# Patient Record
Sex: Female | Born: 1985 | Hispanic: No | Marital: Single | Smoking: Never smoker
Health system: Southern US, Community
[De-identification: ages and names within clinical notes are randomized; demographics above are authoritative.]

## PROBLEM LIST (undated history)

## (undated) DIAGNOSIS — A048 Other specified bacterial intestinal infections: Secondary | ICD-10-CM

## (undated) DIAGNOSIS — F419 Anxiety disorder, unspecified: Secondary | ICD-10-CM

## (undated) DIAGNOSIS — E785 Hyperlipidemia, unspecified: Secondary | ICD-10-CM

## (undated) DIAGNOSIS — K219 Gastro-esophageal reflux disease without esophagitis: Secondary | ICD-10-CM

## (undated) DIAGNOSIS — I10 Essential (primary) hypertension: Secondary | ICD-10-CM

## (undated) DIAGNOSIS — K529 Noninfective gastroenteritis and colitis, unspecified: Secondary | ICD-10-CM

## (undated) HISTORY — DX: Essential (primary) hypertension: I10

## (undated) HISTORY — DX: Hyperlipidemia, unspecified: E78.5

## (undated) HISTORY — DX: Anxiety disorder, unspecified: F41.9

## (undated) HISTORY — DX: Gastro-esophageal reflux disease without esophagitis: K21.9

---

## 2009-08-22 HISTORY — PX: CHALAZION EXCISION: SHX213

## 2013-08-22 HISTORY — PX: DILATION AND CURETTAGE OF UTERUS: SHX78

## 2018-10-05 ENCOUNTER — Encounter (HOSPITAL_COMMUNITY): Payer: Self-pay | Admitting: Emergency Medicine

## 2018-10-05 ENCOUNTER — Ambulatory Visit (INDEPENDENT_AMBULATORY_CARE_PROVIDER_SITE_OTHER): Payer: BC Managed Care – PPO

## 2018-10-05 ENCOUNTER — Ambulatory Visit (HOSPITAL_COMMUNITY)
Admission: EM | Admit: 2018-10-05 | Discharge: 2018-10-05 | Disposition: A | Discharge status: Home / Self Care | Payer: BC Managed Care – PPO | Attending: Internal Medicine | Admitting: Internal Medicine

## 2018-10-05 DIAGNOSIS — R05 Cough: Secondary | ICD-10-CM

## 2018-10-05 DIAGNOSIS — K529 Noninfective gastroenteritis and colitis, unspecified: Secondary | ICD-10-CM | POA: Diagnosis not present

## 2018-10-05 MED ORDER — DM-GUAIFENESIN ER 30-600 MG PO TB12: 1.0000 | ORAL_TABLET | Freq: Two times a day (BID) | ORAL | 0 refills | Status: AC

## 2018-10-05 MED ORDER — LIDOCAINE-EPINEPHRINE-TETRACAINE (LET) SOLUTION
NASAL | Status: AC
  Filled 2018-10-05: qty 3

## 2018-10-05 MED ORDER — PANTOPRAZOLE SODIUM 20 MG PO TBEC: 20.0000 mg | DELAYED_RELEASE_TABLET | Freq: Every day | ORAL | 0 refills | Status: DC

## 2018-10-05 NOTE — ED Provider Notes (Signed)
[facility]    CSN: [id number] Arrival date & time: 10/05/18  1801     History   Chief Complaint Chief Complaint  Patient presents with  . Cough  . Diarrhea    HPI Kelsey Sutton is a 33 y.o. female with a history of fluid retention on hydrochlorothiazide, hypertension on clonidine comes to the urgent care with complaints of cough and diarrhea of 2 days duration.   Diarrhea is profuse.  There is no blood in the stool.  No relieving factors.  It is associated with some cough which is not productive but she knows that she has fluid on her chest.  No chest pain or chest pressure.  No fever or chills.  She denies abdominal pain, distention or cramps.  Appetite is preserved.  Patient admits to positive sick contacts at work HPI  Past medical history: ADHD, hypertension, anxiety  There are no active problems to display for this patient.   History reviewed. No pertinent surgical history.  OB History   No obstetric history on file.      Home Medications    Prior to Admission medications   Medication Sig Start Date End Date Taking? Authorizing Provider  amphetamine-dextroamphetamine (ADDERALL XR) 30 MG 24 hr capsule Take 30 mg by mouth daily.   Yes [provider]  cloNIDine (CATAPRES) 0.1 MG tablet Take 0.1 mg by mouth 2 (two) times daily.   Yes [provider]  escitalopram (LEXAPRO) 20 MG tablet Take 20 mg by mouth daily.   Yes [provider]  hydrochlorothiazide (HYDRODIURIL) 25 MG tablet Take 25 mg by mouth daily.   Yes [provider]  dextromethorphan-guaiFENesin (MUCINEX DM) 30-600 MG 12hr tablet Take 1 tablet by mouth 2 (two) times daily for 7 days. 10/05/18 10/12/18  Merrilee Jansky, MD  pantoprazole (PROTONIX) 20 MG tablet Take 1 tablet (20 mg total) by mouth daily. 10/05/18   Ayushi Pla, Britta Mccreedy, MD    Family History Family History  Problem Relation Age of Onset  . Cancer Father     Social History Social History    Tobacco Use  . Smoking status: Never Smoker  Substance Use Topics  . Alcohol use: Never    Frequency: Never  . Drug use: Not on file     Allergies   Penicillins   Review of Systems Review of Systems  Constitutional: Positive for activity change. Negative for appetite change, chills, fatigue and fever.  HENT: Positive for congestion and sore throat. Negative for ear discharge, ear pain, rhinorrhea, sinus pressure and sneezing.   Respiratory: Negative for chest tightness and shortness of breath.   Gastrointestinal: Negative for abdominal distention and abdominal pain.  Genitourinary: Negative for dysuria and urgency.  Neurological: Negative for dizziness, light-headedness and numbness.     Physical Exam Triage Vital Signs ED Triage Vitals  Enc Vitals Group     BP 10/05/18 1821 137/83     Pulse Rate 10/05/18 1820 91     Resp 10/05/18 1820 18     Temp 10/05/18 1820 97.7 F (36.5 C)     Temp src --      SpO2 10/05/18 1820 100 %     Weight --      Height --      Head Circumference --      Peak Flow --      Pain Score 10/05/18 1822 7     Pain Loc --      Pain Edu? --  Excl. in GC? --    No data found.  Updated Vital Signs BP 137/83   Pulse 91   Temp 97.7 F (36.5 C)   Resp 18   LMP 10/02/2018 (Exact Date)   SpO2 100%   Visual Acuity Right Eye Distance:   Left Eye Distance:   Bilateral Distance:    Right Eye Near:   Left Eye Near:    Bilateral Near:     Physical Exam HENT:     Right Ear: Tympanic membrane normal.     Left Ear: Tympanic membrane normal.     Nose: Congestion present.     Mouth/Throat:     Mouth: Mucous membranes are moist.     Pharynx: No oropharyngeal exudate or posterior oropharyngeal erythema.  Neck:     Musculoskeletal: No neck rigidity.  Cardiovascular:     Rate and Rhythm: Normal rate and regular rhythm.  Pulmonary:     Effort: Pulmonary effort is normal. No respiratory distress.     Breath sounds: No wheezing or  rales.  Abdominal:     General: Bowel sounds are normal. There is no distension.     Tenderness: There is no abdominal tenderness. There is no guarding.  Musculoskeletal: Normal range of motion.        General: No swelling or deformity.  Lymphadenopathy:     Cervical: No cervical adenopathy.  Skin:    General: Skin is warm.     Capillary Refill: Capillary refill takes less than 2 seconds.      UC Treatments / Results  Labs (all labs ordered are listed, but only abnormal results are displayed) Labs Reviewed - No data to display  EKG None  Radiology Dg Chest 2 View  Result Date: 10/05/2018 CLINICAL DATA:  Congestion with low-grade fever 2 days. EXAM: CHEST - 2 VIEW COMPARISON:  None. FINDINGS: Lungs are adequately inflated and otherwise clear. Cardiomediastinal silhouette is normal bones and soft tissues unremarkable. IMPRESSION: No active cardiopulmonary disease. Electronically Signed   By: Elberta Fortis M.D.   On: 10/05/2018 19:07    Procedures Procedures (including critical care time)  Medications Ordered in UC Medications - No data to display  Initial Impression / Assessment and Plan / UC Course  I have reviewed the triage vital signs and the nursing notes.  Pertinent labs & imaging results that were available during my care of the patient were reviewed by me and considered in my medical decision making (see chart for details).     1.  Acute viral gastroenteritis: Encourage balanced electrolyte fluid intake Probiotics over-the-counter Protonix 20 mg orally daily Chest x-ray was read as negative for any acute intrathoracic process  2.  Hypertension: Controlled Final Clinical Impressions(s) / UC Diagnoses   Final diagnoses:  Gastroenteritis   Discharge Instructions   None    ED Prescriptions    Medication Sig Dispense Auth. Provider   dextromethorphan-guaiFENesin (MUCINEX DM) 30-600 MG 12hr tablet Take 1 tablet by mouth 2 (two) times daily for 7 days. 14  tablet Jaquia Benedicto, Britta Mccreedy, MD   pantoprazole (PROTONIX) 20 MG tablet Take 1 tablet (20 mg total) by mouth daily. 30 tablet Kashus Karlen, Britta Mccreedy, MD     Controlled Substance Prescriptions [place] Controlled Substance Registry consulted? No   Merrilee Jansky, MD 10/05/18 [phone]

## 2018-10-05 NOTE — ED Triage Notes (Signed)
Pt c/o cough and chest congestion. x2 days. Pt also c/o diarrhea x2 days as well.

## 2019-11-10 ENCOUNTER — Ambulatory Visit (HOSPITAL_COMMUNITY)
Admission: EM | Admit: 2019-11-10 | Discharge: 2019-11-10 | Disposition: A | Discharge status: Home / Self Care | Payer: Self-pay | Attending: Emergency Medicine | Admitting: Emergency Medicine

## 2019-11-10 ENCOUNTER — Other Ambulatory Visit: Payer: Self-pay

## 2019-11-10 ENCOUNTER — Ambulatory Visit (INDEPENDENT_AMBULATORY_CARE_PROVIDER_SITE_OTHER): Payer: Self-pay

## 2019-11-10 ENCOUNTER — Encounter (HOSPITAL_COMMUNITY): Payer: Self-pay | Admitting: Emergency Medicine

## 2019-11-10 DIAGNOSIS — R0602 Shortness of breath: Secondary | ICD-10-CM

## 2019-11-10 DIAGNOSIS — R21 Rash and other nonspecific skin eruption: Secondary | ICD-10-CM

## 2019-11-10 DIAGNOSIS — K219 Gastro-esophageal reflux disease without esophagitis: Secondary | ICD-10-CM

## 2019-11-10 MED ORDER — LIDOCAINE VISCOUS HCL 2 % MT SOLN
OROMUCOSAL | Status: AC
  Filled 2019-11-10: qty 15

## 2019-11-10 MED ORDER — ALUM & MAG HYDROXIDE-SIMETH 200-200-20 MG/5ML PO SUSP
ORAL | Status: AC
  Filled 2019-11-10: qty 30

## 2019-11-10 MED ORDER — LIDOCAINE VISCOUS HCL 2 % MT SOLN
15.0000 mL | Freq: Once | OROMUCOSAL | Status: AC
  Administered 2019-11-10: 15 mL via ORAL

## 2019-11-10 MED ORDER — TRETINOIN 0.01 % EX GEL: Freq: Every day | CUTANEOUS | 0 refills | Status: DC

## 2019-11-10 MED ORDER — ALUM & MAG HYDROXIDE-SIMETH 200-200-20 MG/5ML PO SUSP
30.0000 mL | Freq: Once | ORAL | Status: AC
  Administered 2019-11-10: 30 mL via ORAL

## 2019-11-10 MED ORDER — PANTOPRAZOLE SODIUM 20 MG PO TBEC: 20.0000 mg | DELAYED_RELEASE_TABLET | Freq: Every day | ORAL | 0 refills | Status: DC

## 2019-11-10 NOTE — ED Triage Notes (Signed)
PT reports abdominal bloating, pain, "fluid in chest and throat and behind eyes." Burning in throat, bile taste in mouth. She was given pantoprazole and is using that daily, it's not helping.

## 2019-11-10 NOTE — ED Provider Notes (Signed)
[facility]    CSN: [id number] Arrival date & time: 11/10/19  1338      History   Chief Complaint Chief Complaint  Patient presents with  . Gastroesophageal Reflux    HPI Kelsey Sutton is a 34 y.o. female no significant past medical history presenting today for evaluation of possible reflux.  Patient notes that of recently she has felt as if she has a lot of fluid in her stomach, chest and throat.  She feels a lot of acid and bile tasting in her throat reports sour taste in mouth.  Sensations radiate up from stomach.  She denies any associated abdominal pain.  She was seen recently at another urgent care and given pantoprazole 40 mg.  She took this and felt as if her symptoms worsened.  Earlier in the week she was having darker stools, but her stools have returned to normal frequency and color.  She was also screened for Covid which was negative.  Has had a slight cough associated with this.  Has had associated headache and pressure behind her eyes.  She also notes that she has been having increased rashes and breaking out around her mouth, neck and underneath breasts.  She expresses concern about CHF.  Reports she has had intermittent swelling and fluid in her feet which she will occasionally take hydrochlorothiazide for.  HPI  History reviewed. No pertinent past medical history.  There are no problems to display for this patient.   History reviewed. No pertinent surgical history.  OB History   No obstetric history on file.      Home Medications    Prior to Admission medications   Medication Sig Start Date End Date Taking? Authorizing Provider  amphetamine-dextroamphetamine (ADDERALL XR) 30 MG 24 hr capsule Take 30 mg by mouth daily.   Yes [provider]  cloNIDine (CATAPRES) 0.1 MG tablet Take 0.1 mg by mouth 2 (two) times daily.   Yes [provider]  escitalopram (LEXAPRO) 20 MG tablet Take 20 mg by mouth daily.    [provider]    hydrochlorothiazide (HYDRODIURIL) 25 MG tablet Take 25 mg by mouth daily.    [provider]  pantoprazole (PROTONIX) 20 MG tablet Take 1 tablet (20 mg total) by mouth daily for 15 days. 11/10/19 11/25/19  Kamela Blansett C, PA-C  tretinoin (RETIN-A) 0.01 % gel Apply topically at bedtime. 11/10/19   Elray Dains, Junius Creamer, PA-C    Family History Family History  Problem Relation Age of Onset  . Cancer Father     Social History Social History   Tobacco Use  . Smoking status: Never Smoker  . Smokeless tobacco: Never Used  Substance Use Topics  . Alcohol use: Never  . Drug use: Not on file     Allergies   Penicillins   Review of Systems Review of Systems  Constitutional: Positive for fatigue. Negative for activity change, appetite change, chills and fever.  HENT: Positive for sore throat. Negative for congestion, ear pain, rhinorrhea, sinus pressure and trouble swallowing.   Eyes: Negative for discharge and redness.  Respiratory: Positive for cough and shortness of breath. Negative for chest tightness.   Cardiovascular: Positive for chest pain.  Gastrointestinal: Positive for nausea. Negative for abdominal pain, diarrhea and vomiting.  Musculoskeletal: Negative for myalgias.  Skin: Positive for rash.  Neurological: Positive for headaches. Negative for dizziness and light-headedness.     Physical Exam Triage Vital Signs ED Triage Vitals  Enc Vitals Group  BP 11/10/19 1353 123/86     Pulse Rate 11/10/19 1353 87     Resp 11/10/19 1353 16     Temp 11/10/19 1353 98.5 F (36.9 C)     Temp Source 11/10/19 1353 Oral     SpO2 11/10/19 1353 97 %     Weight --      Height --      Head Circumference --      Peak Flow --      Pain Score 11/10/19 1351 10     Pain Loc --      Pain Edu? --      Excl. in GC? --    No data found.  Updated Vital Signs BP 123/86   Pulse 87   Temp 98.5 F (36.9 C) (Oral)   Resp 16   LMP 10/18/2019   SpO2 97%   Visual Acuity Right  Eye Distance:   Left Eye Distance:   Bilateral Distance:    Right Eye Near:   Left Eye Near:    Bilateral Near:     Physical Exam Vitals and nursing note reviewed.  Constitutional:      Appearance: She is well-developed.     Comments: No acute distress, occasionally tearful  HENT:     Head: Normocephalic and atraumatic.     Ears:     Comments: Bilateral ears without tenderness to palpation of external auricle, tragus and mastoid, EAC's without erythema or swelling, TM's with good bony landmarks and cone of light. Non erythematous.     Nose: Nose normal.     Comments: Erythematous nasal mucosa, nonswollen turbinates    Mouth/Throat:     Comments: Oral mucosa pink and moist, no tonsillar enlargement or exudate. Posterior pharynx patent and nonerythematous, no uvula deviation or swelling. Normal phonation.  Eyes:     Conjunctiva/sclera: Conjunctivae normal.  Cardiovascular:     Rate and Rhythm: Normal rate.  Pulmonary:     Effort: Pulmonary effort is normal. No respiratory distress.     Comments: Breathing comfortably at rest, CTABL, no wheezing, rales or other adventitious sounds auscultated Abdominal:     General: There is no distension.     Comments: Soft, nondistended, nontender to light and deep palpation throughout abdomen  Musculoskeletal:        General: Normal range of motion.     Cervical back: Neck supple.     Comments: Bilateral lower legs without any obvious swelling, no calf tenderness  Dorsalis pedis 2+ bilaterally  Skin:    General: Skin is warm and dry.     Comments: Perioral area with hyperpigmented circular scarring with occasional pustule lesion  Neurological:     Mental Status: She is alert and oriented to person, place, and time.      UC Treatments / Results  Labs (all labs ordered are listed, but only abnormal results are displayed) Labs Reviewed - No data to display  EKG   Radiology DG Chest 2 View  Result Date: 11/10/2019 CLINICAL DATA:   34 year old female with history of chest discomfort and shortness of breath. EXAM: CHEST - 2 VIEW COMPARISON:  Chest x-ray 10/05/2018. FINDINGS: Lung volumes are normal. No consolidative airspace disease. No pleural effusions. No pneumothorax. No pulmonary nodule or mass noted. Pulmonary vasculature and the cardiomediastinal silhouette are within normal limits. IMPRESSION: No radiographic evidence of acute cardiopulmonary disease. Electronically Signed   By: Trudie Reed M.D.   On: 11/10/2019 15:06    Procedures Procedures (including critical care  time)  Medications Ordered in UC Medications  alum & mag hydroxide-simeth (MAALOX/MYLANTA) 200-200-20 MG/5ML suspension 30 mL (30 mLs Oral Given 11/10/19 1452)    And  lidocaine (XYLOCAINE) 2 % viscous mouth solution 15 mL (15 mLs Oral Given 11/10/19 1452)    Initial Impression / Assessment and Plan / UC Course  I have reviewed the triage vital signs and the nursing notes.  Pertinent labs & imaging results that were available during my care of the patient were reviewed by me and considered in my medical decision making (see chart for details).     EKG normal sinus rhythm, no acute signs of ischemia or infarction.  Chest x-ray without any signs of fluid or pneumonia.  GI cocktail provided with significant improvement in symptoms within throat.  Per patient request we will proceed with decreasing to pantoprazole 20, advised she may use this twice a day, take consistently for the next 2 weeks, supplement with Maalox as needed as PPI working.   Areas around mouth concerning for possible acne scarring.  Will provide Retin-A to use at bedtime to see if this helps with lesions.  Discussed strict return precautions. Patient verbalized understanding and is agreeable with plan.  Final Clinical Impressions(s) / UC Diagnoses   Final diagnoses:  Gastroesophageal reflux disease, unspecified whether esophagitis present  Rash and nonspecific skin eruption       Discharge Instructions     Restart taking Protonix/pantoprazole 20 mg daily, may increase to twice daily if needed-take consistently over the next 2 weeks for acid prevention, this will not provide immediate relief Please use Maalox as needed to supplement in the meantime every 4-6 hours-please get over-the-counter  Please try using Retin-A at bedtime over the next 1 to 2 weeks to help with acne/rash on face  Please follow-up with primary care in a couple of weeks for follow-up If symptoms progressing or worsening please follow-up here or emergency room      ED Prescriptions    Medication Sig Dispense Auth. Provider   pantoprazole (PROTONIX) 20 MG tablet Take 1 tablet (20 mg total) by mouth daily for 15 days. 30 tablet Meera Vasco C, PA-C   tretinoin (RETIN-A) 0.01 % gel Apply topically at bedtime. 45 g Damaree Sargent, [place] C, PA-C     PDMP not reviewed this encounter.   Janith Lima, [place] 11/10/19 [phone]

## 2019-11-10 NOTE — Discharge Instructions (Addendum)
Restart taking Protonix/pantoprazole 20 mg daily, may increase to twice daily if needed-take consistently over the next 2 weeks for acid prevention, this will not provide immediate relief Please use Maalox as needed to supplement in the meantime every 4-6 hours-please get over-the-counter  Please try using Retin-A at bedtime over the next 1 to 2 weeks to help with acne/rash on face  Please follow-up with primary care in a couple of weeks for follow-up If symptoms progressing or worsening please follow-up here or emergency room

## 2020-01-27 ENCOUNTER — Ambulatory Visit (HOSPITAL_COMMUNITY)
Admission: EM | Admit: 2020-01-27 | Discharge: 2020-01-27 | Disposition: A | Discharge status: Home / Self Care | Payer: Medicaid Other | Attending: Family Medicine | Admitting: Family Medicine

## 2020-01-27 ENCOUNTER — Other Ambulatory Visit: Payer: Self-pay

## 2020-01-27 ENCOUNTER — Encounter (HOSPITAL_COMMUNITY): Payer: Self-pay | Admitting: Emergency Medicine

## 2020-01-27 DIAGNOSIS — F908 Attention-deficit hyperactivity disorder, other type: Secondary | ICD-10-CM

## 2020-01-27 DIAGNOSIS — K219 Gastro-esophageal reflux disease without esophagitis: Secondary | ICD-10-CM

## 2020-01-27 DIAGNOSIS — Z760 Encounter for issue of repeat prescription: Secondary | ICD-10-CM

## 2020-01-27 MED ORDER — AMPHETAMINE-DEXTROAMPHET ER 10 MG PO CP24: 10.0000 mg | ORAL_CAPSULE | Freq: Every day | ORAL | 0 refills | Status: AC

## 2020-01-27 MED ORDER — AMPHETAMINE-DEXTROAMPHET ER 30 MG PO CP24: 30.0000 mg | ORAL_CAPSULE | Freq: Every day | ORAL | 0 refills | Status: AC

## 2020-01-27 NOTE — Discharge Instructions (Addendum)
I have refilled your adderall.  I have included information for [facility] for you to get on the list to establish primary care with that office  Follow up as needed

## 2020-01-27 NOTE — ED Triage Notes (Signed)
Pt c/o being out of her adderral x 1 day. She states she takes two separate doses. Pt states her son has had a cold and she has been feeling congested and fatigued. She believes it is due to not having her medication.

## 2020-01-28 NOTE — ED Provider Notes (Signed)
Kelsey Sutton    CSN: [id number] Arrival date & time: 01/27/20  0932      History   Chief Complaint Chief Complaint  Patient presents with  . Medication Refill  . URI    HPI Kelsey Sutton is a 34 y.o. female.   Reports that she has been taking Adderall XR 30mg  and 10mg  daily. Reports that she tried to get this refilled from her primary care provider and they were out of the office. Reports that she also has GERD but has plenty of medication for that. Denies headache, sore throat, cough, SOB, nausea, vomiting, diarrhea, fever, rash, other symptoms.  ROS per HPI  The history is provided by the patient.  Medication Refill URI   History reviewed. No pertinent past medical history.  There are no problems to display for this patient.   History reviewed. No pertinent surgical history.  OB History   No obstetric history on file.      Home Medications    Prior to Admission medications   Medication Sig Start Date End Date Taking? Authorizing Provider  cloNIDine (CATAPRES) 0.1 MG tablet Take 0.1 mg by mouth 2 (two) times daily.   Yes [provider]  hydrochlorothiazide (HYDRODIURIL) 25 MG tablet Take 25 mg by mouth daily.   Yes [provider]  amphetamine-dextroamphetamine (ADDERALL XR) 10 MG 24 hr capsule Take 1 capsule (10 mg total) by mouth daily. 01/27/20   Faustino Congress, NP  amphetamine-dextroamphetamine (ADDERALL XR) 30 MG 24 hr capsule Take 1 capsule (30 mg total) by mouth daily. 01/27/20   Faustino Congress, NP  escitalopram (LEXAPRO) 20 MG tablet Take 20 mg by mouth daily.    [provider]  pantoprazole (PROTONIX) 20 MG tablet Take 1 tablet (20 mg total) by mouth daily for 15 days. 11/10/19 11/25/19  Wieters, Hallie C, PA-C  tretinoin (RETIN-A) 0.01 % gel Apply topically at bedtime. 11/10/19   Wieters, Elesa Hacker, PA-C    Family History Family History  Problem Relation Age of Onset  . Cancer Father     Social  History Social History   Tobacco Use  . Smoking status: Never Smoker  . Smokeless tobacco: Never Used  Substance Use Topics  . Alcohol use: Never  . Drug use: Not on file     Allergies   Penicillins   Review of Systems Review of Systems   Physical Exam Triage Vital Signs ED Triage Vitals  Enc Vitals Group     BP 01/27/20 1034 127/87     Pulse Rate 01/27/20 1034 87     Resp 01/27/20 1034 16     Temp 01/27/20 1034 98.7 F (37.1 C)     Temp Source 01/27/20 1034 Oral     SpO2 01/27/20 1034 97 %     Weight --      Height --      Head Circumference --      Peak Flow --      Pain Score 01/27/20 1030 0     Pain Loc --      Pain Edu? --      Excl. in [place]? --    No data found.  Updated Vital Signs BP 127/87 (BP Location: Right Arm)   Pulse 87   Temp 98.7 F (37.1 C) (Oral)   Resp 16   LMP 01/13/2020   SpO2 97%   Visual Acuity Right Eye Distance:   Left Eye Distance:   Bilateral Distance:    Right Eye  Near:   Left Eye Near:    Bilateral Near:     Physical Exam Vitals and nursing note reviewed.  Constitutional:      General: She is not in acute distress.    Appearance: Normal appearance. She is well-developed. She is obese.  HENT:     Head: Normocephalic and atraumatic.     Nose: Nose normal.     Mouth/Throat:     Mouth: Mucous membranes are moist.     Pharynx: Oropharynx is clear.  Eyes:     Conjunctiva/sclera: Conjunctivae normal.  Cardiovascular:     Rate and Rhythm: Normal rate and regular rhythm.     Heart sounds: Normal heart sounds. No murmur.  Pulmonary:     Effort: Pulmonary effort is normal. No respiratory distress.     Breath sounds: Normal breath sounds. No stridor. No wheezing, rhonchi or rales.  Chest:     Chest wall: No tenderness.  Abdominal:     General: Bowel sounds are normal.     Palpations: Abdomen is soft.     Tenderness: There is no abdominal tenderness.  Musculoskeletal:        General: Normal range of motion.      Cervical back: Normal range of motion and neck supple.  Skin:    General: Skin is warm and dry.     Capillary Refill: Capillary refill takes less than 2 seconds.  Neurological:     General: No focal deficit present.     Mental Status: She is alert and oriented to person, place, and time.  Psychiatric:        Mood and Affect: Mood normal.        Behavior: Behavior normal.        Thought Content: Thought content normal.      UC Treatments / Results  Labs (all labs ordered are listed, but only abnormal results are displayed) Labs Reviewed - No data to display  EKG   Radiology No results found.  Procedures Procedures (including critical care time)  Medications Ordered in UC Medications - No data to display  Initial Impression / Assessment and Plan / UC Course  I have reviewed the triage vital signs and the nursing notes.  Pertinent labs & imaging results that were available during my care of the patient were reviewed by me and considered in my medical decision making (see chart for details).     Medication Refill ADHD GERD No issues with GERC, has plenty of medication for this Needs Adderall refilled Per [place] PDMP, she has been receiving Adderall XR 30mg  and Adderall XR 10mg , with a month supply given at a time and the last refill prescribed on 12/17/19 Discussed with patient that I would refill once but that she would have to get them from primary care from now on Patient verbalized understanding and is in agreement with treatment Final Clinical Impressions(s) / UC Diagnoses   Final diagnoses:  Gastroesophageal reflux disease without esophagitis  Attention deficit hyperactivity disorder (ADHD), other type  Medication refill     Discharge Instructions     I have refilled your adderall.  I have included information for [facility] for you to get on the list to establish primary care with that office  Follow up as needed    ED Prescriptions     Medication Sig Dispense Auth. Provider   amphetamine-dextroamphetamine (ADDERALL XR) 30 MG 24 hr capsule Take 1 capsule (30 mg total) by mouth daily. 30 capsule 12/19/19,  Judeth Cornfield, NP   amphetamine-dextroamphetamine (ADDERALL XR) 10 MG 24 hr capsule Take 1 capsule (10 mg total) by mouth daily. 30 capsule Moshe Cipro, NP     I have reviewed the PDMP during this encounter.   Moshe Cipro, NP 01/28/20 1626

## 2020-05-29 ENCOUNTER — Encounter: Payer: Self-pay | Admitting: Gastroenterology

## 2020-06-24 ENCOUNTER — Ambulatory Visit (INDEPENDENT_AMBULATORY_CARE_PROVIDER_SITE_OTHER): Payer: Medicaid Other | Admitting: Gastroenterology

## 2020-06-24 ENCOUNTER — Other Ambulatory Visit (INDEPENDENT_AMBULATORY_CARE_PROVIDER_SITE_OTHER): Payer: Medicaid Other

## 2020-06-24 ENCOUNTER — Encounter: Payer: Self-pay | Admitting: Gastroenterology

## 2020-06-24 VITALS — BP 118/80 | HR 85 | Ht 61.0 in | Wt 286.0 lb

## 2020-06-24 DIAGNOSIS — K219 Gastro-esophageal reflux disease without esophagitis: Secondary | ICD-10-CM

## 2020-06-24 DIAGNOSIS — R1030 Lower abdominal pain, unspecified: Secondary | ICD-10-CM

## 2020-06-24 DIAGNOSIS — K5909 Other constipation: Secondary | ICD-10-CM

## 2020-06-24 LAB — COMPREHENSIVE METABOLIC PANEL
ALT: 15 U/L (ref 0–35)
AST: 15 U/L (ref 0–37)
Albumin: 3.8 g/dL (ref 3.5–5.2)
Alkaline Phosphatase: 86 U/L (ref 39–117)
BUN: 10 mg/dL (ref 6–23)
CO2: 29 mEq/L (ref 19–32)
Calcium: 9 mg/dL (ref 8.4–10.5)
Chloride: 102 mEq/L (ref 96–112)
Creatinine, Ser: 0.73 mg/dL (ref 0.40–1.20)
GFR: 106.74 mL/min (ref >60.00)
Glucose, Bld: 79 mg/dL (ref 70–99)
Potassium: 4.1 mEq/L (ref 3.5–5.1)
Sodium: 137 mEq/L (ref 135–145)
Total Bilirubin: 0.8 mg/dL (ref 0.2–1.2)
Total Protein: 7.2 g/dL (ref 6.0–8.3)

## 2020-06-24 LAB — CBC WITH DIFFERENTIAL/PLATELET
Basophils Absolute: 0.1 10*3/uL (ref 0.0–0.1)
Basophils Relative: 0.7 % (ref 0.0–3.0)
Eosinophils Absolute: 0.2 10*3/uL (ref 0.0–0.7)
Eosinophils Relative: 2 % (ref 0.0–5.0)
HCT: 37 % (ref 36.0–46.0)
Hemoglobin: 12 g/dL (ref 12.0–15.0)
Lymphocytes Relative: 31.8 % (ref 12.0–46.0)
Lymphs Abs: 2.4 10*3/uL (ref 0.7–4.0)
MCHC: 32.6 g/dL (ref 30.0–36.0)
MCV: 77.9 fl — ABNORMAL LOW (ref 78.0–100.0)
Monocytes Absolute: 0.7 10*3/uL (ref 0.1–1.0)
Monocytes Relative: 8.7 % (ref 3.0–12.0)
Neutro Abs: 4.3 10*3/uL (ref 1.4–7.7)
Neutrophils Relative %: 56.8 % (ref 43.0–77.0)
Platelets: 429 10*3/uL — ABNORMAL HIGH (ref 150.0–400.0)
RBC: 4.75 Mil/uL (ref 3.87–5.11)
RDW: 16.6 % — ABNORMAL HIGH (ref 11.5–15.5)
WBC: 7.6 10*3/uL (ref 4.0–10.5)

## 2020-06-24 LAB — TSH: TSH: 1.4 u[IU]/mL (ref 0.35–4.50)

## 2020-06-24 MED ORDER — POLYETHYLENE GLYCOL 3350 17 GM/SCOOP PO POWD: ORAL | 3 refills | Status: DC

## 2020-06-24 MED ORDER — FAMOTIDINE 20 MG PO TABS: 20.0000 mg | ORAL_TABLET | Freq: Two times a day (BID) | ORAL | 5 refills | Status: DC

## 2020-06-24 NOTE — Progress Notes (Signed)
06/24/2020 Kelsey Sutton [id number] [birth year]   HISTORY OF PRESENT ILLNESS: This is a 34 year old female who is new to our office.  She has been referred here by Dr. Pecola Leisure from [facility] [place] for evaluation regarding abdominal pain and GERD.  She tells me that she has had longstanding issues with constipation for as long as she can remember.  Does not use anything regularly, but uses some type of TLC she that helps her go when she takes it.  She also reports that this summer she got really sick and was having a lot of heartburn that was causing her to have palpitations.  She was treated with Protonix, but felt that that made her symptoms worse and made her nauseous.  Then she took Pepcid for a while which seemed to help some.  At some point she was placed on metoclopramide 10 mg which she takes at bedtime.  She feels that that has helped.  She tells me that overall she is feeling much better than she was this summer.  She says that the heartburn and upper abdominal discomfort seems better, but she still gets burning pain in her gut.  She gets intermittent lower abdominal pains as well.  She is asking about checking for hormone imbalance.   Past Medical History:  Diagnosis Date  . GERD (gastroesophageal reflux disease)    No past surgical history on file.  reports that she has never smoked. She has never used smokeless tobacco. She reports previous alcohol use. No history on file for drug use. family history includes Cancer in her father; Inflammatory bowel disease in her mother; Prostate cancer in her father. Allergies  Allergen Reactions  . Penicillins   . Protonix [Pantoprazole] Nausea Only      Outpatient Encounter Medications as of 06/24/2020  Medication Sig  . amphetamine-dextroamphetamine (ADDERALL XR) 10 MG 24 hr capsule Take 1 capsule (10 mg total) by mouth daily.  Marland Kitchen amphetamine-dextroamphetamine (ADDERALL XR) 30 MG 24 hr capsule Take 1 capsule (30 mg total) by mouth  daily.  . cloNIDine (CATAPRES) 0.1 MG tablet Take 0.1 mg by mouth at bedtime as needed.   . hydrochlorothiazide (HYDRODIURIL) 25 MG tablet Take 25 mg by mouth daily.  . hydrOXYzine (VISTARIL) 25 MG capsule Take 25 mg by mouth as needed. For anxiety PRN  . erythromycin base (E-MYCIN) 500 MG tablet Take 500 mg by mouth 2 (two) times daily.  . metoCLOPramide (REGLAN) 10 MG tablet Take 10 mg by mouth as needed.  . [DISCONTINUED] escitalopram (LEXAPRO) 20 MG tablet Take 20 mg by mouth daily. (Patient not taking: Reported on 06/24/2020)  . [DISCONTINUED] pantoprazole (PROTONIX) 20 MG tablet Take 1 tablet (20 mg total) by mouth daily for 15 days.  . [DISCONTINUED] tretinoin (RETIN-A) 0.01 % gel Apply topically at bedtime. (Patient not taking: Reported on 06/24/2020)   No facility-administered encounter medications on file as of 06/24/2020.     REVIEW OF SYSTEMS  : All other systems reviewed and negative except where noted in the History of Present Illness.   PHYSICAL EXAM: BP 118/80   Pulse 85   Ht 5\' 1"  (1.549 m)   Wt 286 lb (129.7 kg)   SpO2 97%   BMI 54.04 kg/m  General: Well developed AA female in no acute distress Head: Normocephalic and atraumatic Eyes:  Sclerae anicteric, conjunctiva pink. Ears: Normal auditory acuity Lungs: Clear throughout to auscultation; no W/R/R. Heart: Regular rate and rhythm; no M/R/G. Abdomen: Soft, non-distended.  BS present.  Mild diffuse TTP. Musculoskeletal: Symmetrical with no gross deformities  Skin: No lesions on visible extremities Extremities: No edema  Neurological: Alert oriented x 4, grossly non-focal Psychological:  Alert and cooperative. Normal mood and affect  ASSESSMENT AND PLAN: *Chronic constipation:  Long-standing.  Currently uses some type of tea intermittently.  Will try Miralax daily. *GERD:  Will try pepcid 20 mg BID (says that pantoprazole made her symptoms worse and made her nauseous).  Continue metoclopramide at bedtime for now  since that seems to be helping some.  Prescription for pepcid sent to pharmacy. *Intermittent lower abdominal pain:  ? Related to constipation vs GYN source vs other.  We will see how her pain seems to be after more aggressively treating her constipation.  I also recommended that she see GYN since she has not been seen by a gynecologist for quite some time.  She is also asking about checking hormone levels, etc. which could be addressed by them as well.  **I will check labs as well including a CBC, CMP, TSH, and celiac labs. **Pending the above she may need endoscopic evaluation versus some type of imaging.  She will follow up with me in 4 to 6 weeks.  Would need any procedures performed at [facility] due to high BMI.  CC:  Care, [facility]

## 2020-06-24 NOTE — Patient Instructions (Addendum)
Your provider has requested that you go to the basement level for lab work before leaving today. Press "B" on the elevator. The lab is located at the first door on the left as you exit the elevator.  Due to recent changes in healthcare laws, you may see the results of your imaging and laboratory studies on MyChart before your provider has had a chance to review them.  We understand that in some cases there may be results that are confusing or concerning to you. Not all laboratory results come back in the same time frame and the provider may be waiting for multiple results in order to interpret others.  Please give [place] 48 hours in order for your provider to thoroughly review all the results before contacting the office for clarification of your results.    Take Miralax 1 capful daily in 8 ounces of liquid   We will send Pepcid to your pharmacy  Continue metoclopramide at bedtime  Make sure to make an appointment with your GYN  If you are age 56 or older, your body mass index should be between 23-30. Your Body mass index is 54.04 kg/m. If this is out of the aforementioned range listed, please consider follow up with your Primary Care Provider.  If you are age 52 or younger, your body mass index should be between 19-25. Your Body mass index is 54.04 kg/m. If this is out of the aformentioned range listed, please consider follow up with your Primary Care Provider.    I appreciate the  opportunity to care for you  Thank You   Shanda Bumps Zehr,PA-C

## 2020-06-25 LAB — TISSUE TRANSGLUTAMINASE, IGA: (tTG) Ab, IgA: <1 U/mL

## 2020-06-25 LAB — IGA: Immunoglobulin A: 361 mg/dL — ABNORMAL HIGH (ref 47–310)

## 2020-06-25 NOTE — Telephone Encounter (Signed)
It does not have any clinical significance.  It only becomes a problem if it is low.  Nothing to address.  We use it as part of our celiac testing.  Celiac study is negative.

## 2020-06-25 NOTE — Progress Notes (Signed)
Noted  

## 2020-07-13 DIAGNOSIS — I10 Essential (primary) hypertension: Secondary | ICD-10-CM | POA: Diagnosis not present

## 2020-07-13 DIAGNOSIS — F900 Attention-deficit hyperactivity disorder, predominantly inattentive type: Secondary | ICD-10-CM | POA: Diagnosis not present

## 2020-07-13 DIAGNOSIS — E669 Obesity, unspecified: Secondary | ICD-10-CM | POA: Diagnosis not present

## 2020-08-05 ENCOUNTER — Ambulatory Visit (INDEPENDENT_AMBULATORY_CARE_PROVIDER_SITE_OTHER): Payer: BC Managed Care – PPO | Admitting: Gastroenterology

## 2020-08-05 ENCOUNTER — Encounter: Payer: Self-pay | Admitting: Gastroenterology

## 2020-08-05 VITALS — BP 110/80 | HR 88 | Ht 62.0 in | Wt 277.2 lb

## 2020-08-05 DIAGNOSIS — K5909 Other constipation: Secondary | ICD-10-CM

## 2020-08-05 DIAGNOSIS — R1013 Epigastric pain: Secondary | ICD-10-CM | POA: Diagnosis not present

## 2020-08-05 DIAGNOSIS — K219 Gastro-esophageal reflux disease without esophagitis: Secondary | ICD-10-CM

## 2020-08-05 NOTE — Patient Instructions (Signed)
If you are age 35 or older, your body mass index should be between 23-30. Your Body mass index is 50.71 kg/m. If this is out of the aforementioned range listed, please consider follow up with your Primary Care Provider.  If you are age 50 or younger, your body mass index should be between 19-25. Your Body mass index is 50.71 kg/m. If this is out of the aformentioned range listed, please consider follow up with your Primary Care Provider.   You have been scheduled for an endoscopy. Please follow written instructions given to you at your visit today. If you use inhalers (even only as needed), please bring them with you on the day of your procedure.  Due to recent changes in healthcare laws, you may see the results of your imaging and laboratory studies on MyChart before your provider has had a chance to review them.  We understand that in some cases there may be results that are confusing or concerning to you. Not all laboratory results come back in the same time frame and the provider may be waiting for multiple results in order to interpret others.  Please give [place] 48 hours in order for your provider to thoroughly review all the results before contacting the office for clarification of your results.

## 2020-08-05 NOTE — Progress Notes (Signed)
Noted.  May need PPI.  Needs reflux precautions with attention to weight loss

## 2020-08-05 NOTE — Progress Notes (Signed)
08/05/2020 Kelsey Sutton [id number] 05/26/1986   HISTORY OF PRESENT ILLNESS: This is a 34 year old female who is here for follow-up of her GERD, epigastric abdominal pain, and constipation.  At her last visit on November 3 I placed her on Pepcid 20 mg twice daily.  She is still using metoclopramide at bedtime as well.  She feels like the Pepcid does help some, but she would like to proceed with endoscopy.  She thinks that she has ulcers.  She still has some epigastric abdominal pain that she describes as burning.  Stated previously that pantoprazole seemed to make her symptoms worse.  In regards to the constipation she had been using an herbal laxative tea called TLC.  That does help her.  We also discussed MiraLAX regularly and she does that some as well.   Past Medical History:  Diagnosis Date  . GERD (gastroesophageal reflux disease)    History reviewed. No pertinent surgical history.  reports that she has never smoked. She has never used smokeless tobacco. She reports previous alcohol use. No history on file for drug use. family history includes Cancer in her father; Inflammatory bowel disease in her mother; Prostate cancer in her father. Allergies  Allergen Reactions  . Penicillins   . Protonix [Pantoprazole] Nausea Only      Outpatient Encounter Medications as of 08/05/2020  Medication Sig  . amphetamine-dextroamphetamine (ADDERALL XR) 10 MG 24 hr capsule Take 1 capsule (10 mg total) by mouth daily.  Marland Kitchen amphetamine-dextroamphetamine (ADDERALL XR) 30 MG 24 hr capsule Take 1 capsule (30 mg total) by mouth daily.  . cloNIDine (CATAPRES) 0.1 MG tablet Take 0.1 mg by mouth at bedtime as needed.   . famotidine (PEPCID) 20 MG tablet Take 1 tablet (20 mg total) by mouth 2 (two) times daily.  . hydrochlorothiazide (HYDRODIURIL) 25 MG tablet Take 25 mg by mouth daily.  . hydrOXYzine (VISTARIL) 25 MG capsule Take 25 mg by mouth as needed. For anxiety PRN  . metoCLOPramide (REGLAN) 10  MG tablet Take 10 mg by mouth as needed.  . polyethylene glycol powder (GLYCOLAX/MIRALAX) 17 GM/SCOOP powder Take 1 capful daily in 8 ounces of juice  . [DISCONTINUED] erythromycin base (E-MYCIN) 500 MG tablet Take 500 mg by mouth 2 (two) times daily.   No facility-administered encounter medications on file as of 08/05/2020.     REVIEW OF SYSTEMS  : All other systems reviewed and negative except where noted in the History of Present Illness.   PHYSICAL EXAM: BP 110/80 (BP Location: Left Arm, Patient Position: Sitting, Cuff Size: Normal)   Pulse 88   Ht 5\' 2"  (1.575 m) Comment: height measured without shoes  Wt 277 lb 4 oz (125.8 kg)   LMP 07/30/2020   BMI 50.71 kg/m  General: Well developed AA female in no acute distress Head: Normocephalic and atraumatic Eyes:  Sclerae anicteric, conjunctiva pink. Ears: Normal auditory acuity Lungs: Clear throughout to auscultation; no W/R/R. Heart: Regular rate and rhythm; no M/R/G. Abdomen: Soft, non-distended.  BS present.  Mild epigastric TTP. Musculoskeletal: Symmetrical with no gross deformities  Skin: No lesions on visible extremities Extremities: No edema  Neurological: Alert oriented x 4, grossly non-focal Psychological:  Alert and cooperative. Normal mood and affect  ASSESSMENT AND PLAN: *GERD and epigastric abdominal pain:  Somewhat better on pepcid BID, but still with symptoms.  Thinks that she has ulcers and would like to proceed with EGD.  Will plan for this at [facility] with Dr. ACADIA MONTANA.  The risks, benefits, and alternatives to EGD were discussed with the patient and she consents to proceed.  ? Hpylori, ulcer disease, esophagitis, etc. *Chronic constipation:  Will continue with her tea regularly or Miralax daily.     CC:  Care, [facility]

## 2020-09-15 ENCOUNTER — Telehealth: Payer: Self-pay | Admitting: Gastroenterology

## 2020-09-15 NOTE — Telephone Encounter (Signed)
Precedure appt and covid test cancelled. Pt reports she will call back to reschedule the appt.

## 2020-09-15 NOTE — Telephone Encounter (Signed)
Inbound call from patient stating she needs to cancel her procedure scheduled at [facility] on 09/22/2020 due to work schedule.  She will call back once she is ready to reschedule procedure.

## 2020-09-17 ENCOUNTER — Other Ambulatory Visit (HOSPITAL_COMMUNITY): Payer: Self-pay

## 2020-09-22 ENCOUNTER — Encounter (HOSPITAL_COMMUNITY): Admission: RE | Payer: Self-pay | Source: Home / Self Care

## 2020-09-22 ENCOUNTER — Ambulatory Visit (HOSPITAL_COMMUNITY): Admission: RE | Admit: 2020-09-22 | Payer: Medicaid Other | Source: Home / Self Care | Admitting: Internal Medicine

## 2020-09-22 SURGERY — ESOPHAGOGASTRODUODENOSCOPY (EGD) WITH PROPOFOL
Anesthesia: Monitor Anesthesia Care

## 2021-05-27 ENCOUNTER — Emergency Department (HOSPITAL_BASED_OUTPATIENT_CLINIC_OR_DEPARTMENT_OTHER): Payer: BC Managed Care – PPO

## 2021-05-27 ENCOUNTER — Other Ambulatory Visit: Payer: Self-pay

## 2021-05-27 ENCOUNTER — Emergency Department (HOSPITAL_BASED_OUTPATIENT_CLINIC_OR_DEPARTMENT_OTHER)
Admission: EM | Admit: 2021-05-27 | Discharge: 2021-05-27 | Disposition: A | Discharge status: Home / Self Care | Payer: BC Managed Care – PPO | Source: Home / Self Care | Attending: Emergency Medicine | Admitting: Emergency Medicine

## 2021-05-27 ENCOUNTER — Encounter (HOSPITAL_COMMUNITY): Payer: Self-pay | Admitting: *Deleted

## 2021-05-27 ENCOUNTER — Emergency Department (HOSPITAL_COMMUNITY)
Admission: EM | Admit: 2021-05-27 | Discharge: 2021-05-27 | Disposition: A | Discharge status: Home / Self Care | Payer: BC Managed Care – PPO | Attending: Emergency Medicine | Admitting: Emergency Medicine

## 2021-05-27 DIAGNOSIS — F419 Anxiety disorder, unspecified: Secondary | ICD-10-CM | POA: Diagnosis not present

## 2021-05-27 DIAGNOSIS — K259 Gastric ulcer, unspecified as acute or chronic, without hemorrhage or perforation: Secondary | ICD-10-CM | POA: Insufficient documentation

## 2021-05-27 DIAGNOSIS — R197 Diarrhea, unspecified: Secondary | ICD-10-CM | POA: Insufficient documentation

## 2021-05-27 DIAGNOSIS — K219 Gastro-esophageal reflux disease without esophagitis: Secondary | ICD-10-CM | POA: Diagnosis not present

## 2021-05-27 DIAGNOSIS — K529 Noninfective gastroenteritis and colitis, unspecified: Secondary | ICD-10-CM | POA: Insufficient documentation

## 2021-05-27 DIAGNOSIS — R109 Unspecified abdominal pain: Secondary | ICD-10-CM | POA: Insufficient documentation

## 2021-05-27 DIAGNOSIS — Z5321 Procedure and treatment not carried out due to patient leaving prior to being seen by health care provider: Secondary | ICD-10-CM | POA: Insufficient documentation

## 2021-05-27 LAB — COMPREHENSIVE METABOLIC PANEL
ALT: 14 U/L (ref 0–44)
AST: 17 U/L (ref 15–41)
Albumin: 3.7 g/dL (ref 3.5–5.0)
Alkaline Phosphatase: 75 U/L (ref 38–126)
Anion gap: 6 (ref 5–15)
BUN: 8 mg/dL (ref 6–20)
CO2: 23 mmol/L (ref 22–32)
Calcium: 9.1 mg/dL (ref 8.9–10.3)
Chloride: 105 mmol/L (ref 98–111)
Creatinine, Ser: 0.7 mg/dL (ref 0.44–1.00)
GFR, Estimated: >60 mL/min (ref >60)
Glucose, Bld: 121 mg/dL — ABNORMAL HIGH (ref 70–99)
Potassium: 3.6 mmol/L (ref 3.5–5.1)
Sodium: 134 mmol/L — ABNORMAL LOW (ref 135–145)
Total Bilirubin: 0.7 mg/dL (ref 0.3–1.2)
Total Protein: 7.4 g/dL (ref 6.5–8.1)

## 2021-05-27 LAB — CBC
HCT: 40.1 % (ref 36.0–46.0)
Hemoglobin: 12.2 g/dL (ref 12.0–15.0)
MCH: 25.2 pg — ABNORMAL LOW (ref 26.0–34.0)
MCHC: 30.4 g/dL (ref 30.0–36.0)
MCV: 82.9 fL (ref 80.0–100.0)
Platelets: 424 10*3/uL — ABNORMAL HIGH (ref 150–400)
RBC: 4.84 MIL/uL (ref 3.87–5.11)
RDW: 16.1 % — ABNORMAL HIGH (ref 11.5–15.5)
WBC: 6.8 10*3/uL (ref 4.0–10.5)
nRBC: 0 % (ref 0.0–0.2)

## 2021-05-27 LAB — I-STAT BETA HCG BLOOD, ED (MC, WL, AP ONLY): I-stat hCG, quantitative: <5 m[IU]/mL (ref <5)

## 2021-05-27 LAB — LIPASE, BLOOD: Lipase: 28 U/L (ref 11–51)

## 2021-05-27 MED ORDER — CIPROFLOXACIN IN D5W 400 MG/200ML IV SOLN
400.0000 mg | Freq: Once | INTRAVENOUS | Status: AC
  Administered 2021-05-27: 400 mg via INTRAVENOUS
  Filled 2021-05-27: qty 200

## 2021-05-27 MED ORDER — MORPHINE SULFATE (PF) 4 MG/ML IV SOLN
4.0000 mg | Freq: Once | INTRAVENOUS | Status: AC
  Administered 2021-05-27: 4 mg via INTRAVENOUS
  Filled 2021-05-27: qty 1

## 2021-05-27 MED ORDER — ONDANSETRON HCL 4 MG/2ML IJ SOLN
4.0000 mg | Freq: Once | INTRAMUSCULAR | Status: AC
  Administered 2021-05-27: 4 mg via INTRAVENOUS
  Filled 2021-05-27: qty 2

## 2021-05-27 MED ORDER — METRONIDAZOLE 500 MG PO TABS: 500.0000 mg | ORAL_TABLET | Freq: Two times a day (BID) | ORAL | 0 refills | Status: DC

## 2021-05-27 MED ORDER — IOHEXOL 300 MG/ML  SOLN: 100.0000 mL | Freq: Once | INTRAMUSCULAR | Status: DC | PRN

## 2021-05-27 MED ORDER — HYDROCODONE-ACETAMINOPHEN 5-325 MG PO TABS: 1.0000 | ORAL_TABLET | Freq: Four times a day (QID) | ORAL | 0 refills | Status: DC | PRN

## 2021-05-27 MED ORDER — METRONIDAZOLE 500 MG PO TABS
500.0000 mg | ORAL_TABLET | Freq: Once | ORAL | Status: AC
  Administered 2021-05-27: 500 mg via ORAL
  Filled 2021-05-27: qty 1

## 2021-05-27 MED ORDER — CIPROFLOXACIN HCL 500 MG PO TABS: 500.0000 mg | ORAL_TABLET | Freq: Two times a day (BID) | ORAL | 0 refills | Status: DC

## 2021-05-27 MED ORDER — SODIUM CHLORIDE 0.9 % IV BOLUS
1000.0000 mL | Freq: Once | INTRAVENOUS | Status: AC
  Administered 2021-05-27: 1000 mL via INTRAVENOUS

## 2021-05-27 MED ORDER — IOHEXOL 350 MG/ML SOLN
80.0000 mL | Freq: Once | INTRAVENOUS | Status: AC | PRN
  Administered 2021-05-27: 80 mL via INTRAVENOUS

## 2021-05-27 NOTE — ED Triage Notes (Signed)
Left [facility] ER because of long wait. Bright red bleeding after runny BM. Hx GI issues.  Had blood work up at [facility]

## 2021-05-27 NOTE — ED Triage Notes (Signed)
Per EMS, pt complains of abdominal pain, diarrhea. States she went out to eat last night and had a few drinks, is concerned for food poisoning. Feels anxious because she can't take her anxiety meds. Hx of stomach ulcer, has been acting up.   BP 127/82 HR 79 SpO2 100% on RA

## 2021-05-27 NOTE — Discharge Instructions (Addendum)
You have been evaluated for your symptoms and had been diagnosed with colitis which is inflammation or infection of your colon.  Please take antibiotic as prescribed.  However, it is important for you to call and follow-up closely with gastroenterologist for further evaluation as this could be related to an autoimmune inflammation such as Crohn's disease or ulcerative colitis.  Furthermore, incidentally your left adrenal gland shows some abnormal changes on the CT scan.  Discussed this with your primary care doctor for further evaluation.

## 2021-05-27 NOTE — ED Provider Notes (Signed)
Emergency Medicine Provider Triage Evaluation Note  Kelsey Sutton , a 35 y.o. female  was evaluated in triage.  Pt complains of abdominal pain, similar to prior episodes, limited relief with Pepcid. Has taken pepto today. Does not think its food poisoning.   Review of Systems  Positive: Abdominal pain, anxiety, nausea Negative: diarrhea  Physical Exam  LMP 05/01/2021  Gen:   Awake, no distress   Resp:  Normal effort  MSK:   Moves extremities without difficulty  Other:    Medical Decision Making  Medically screening exam initiated at 8:03 AM.  Appropriate orders placed.  Elisabetta Mishra was informed that the remainder of the evaluation will be completed by another provider, this initial triage assessment does not replace that evaluation, and the importance of remaining in the ED until their evaluation is complete.     Jeannie Fend, PA-C 05/27/21 [id number]    Terrilee Files, MD 05/27/21 [phone]

## 2021-05-27 NOTE — ED Provider Notes (Signed)
[facility] EMERGENCY DEPT Provider Note   CSN: [id number] Arrival date & time: 05/27/21  1244     History Chief Complaint  Patient presents with   Abdominal Pain    Rectal bleed    Kelsey Sutton is a 35 y.o. female.  The history is provided by the patient. No language interpreter was used.  Abdominal Pain  35 year old female significant history of GERD, chronic constipation, presenting for evaluation of abdominal pain.  Patient report last week she was constipated, and was having trouble with her hemorrhoid which did improve.  Last night she developed intermittent sharp pain to the left side of abdomen that became dull usually lasting from 20 minutes up to an hour and happen intermittently.  Pain at times severe causing her to break out in a sweat.  She also endorsed some nausea occasional vomiting with last episode of vomiting last night.  Vomitus is nonbloody nonbilious.  She also reported having loose stool 2 days with blood mixed with stool.  Blood is bright red, and rectum felt irritated and burning.  No associated fever chills no chest pain shortness of breath vaginal bleeding vaginal discharge or dysuria.  She tries taking Imodium and Pepto-Bismol today without adequate relief.  She admits to drinking 2 results of alcohol yesterday but denies heavy alcohol use.  Her last menstrual period was September 10.  She mention having somewhat of a similar episodes of pain in the past and was scheduled to follow-up with a GI specialist however she missed her appointment and have not follow-up.  She denies any recent sick contact.  No recent antibiotic use.    Past Medical History:  Diagnosis Date   GERD (gastroesophageal reflux disease)     Patient Active Problem List   Diagnosis Date Noted   Abdominal pain, epigastric 08/05/2020   Gastroesophageal reflux disease 06/24/2020   Chronic constipation 06/24/2020   Lower abdominal pain 06/24/2020    No past surgical  history on file.   OB History   No obstetric history on file.     Family History  Problem Relation Age of Onset   Cancer Father    Prostate cancer Father    Inflammatory bowel disease Mother    Colon cancer Neg Hx    Pancreatic cancer Neg Hx    Esophageal cancer Neg Hx     Social History   Tobacco Use   Smoking status: Never   Smokeless tobacco: Never  Substance Use Topics   Alcohol use: Not Currently    Home Medications Prior to Admission medications   Medication Sig Start Date End Date Taking? Authorizing Provider  amphetamine-dextroamphetamine (ADDERALL XR) 10 MG 24 hr capsule Take 1 capsule (10 mg total) by mouth daily. 01/27/20   Moshe Cipro, NP  amphetamine-dextroamphetamine (ADDERALL XR) 30 MG 24 hr capsule Take 1 capsule (30 mg total) by mouth daily. 01/27/20   Moshe Cipro, NP  cloNIDine (CATAPRES) 0.1 MG tablet Take 0.1 mg by mouth at bedtime as needed.     [provider]  famotidine (PEPCID) 20 MG tablet Take 1 tablet (20 mg total) by mouth 2 (two) times daily. 06/24/20   Zehr, Princella Pellegrini, PA-C  hydrochlorothiazide (HYDRODIURIL) 25 MG tablet Take 25 mg by mouth daily.    [provider]  hydrOXYzine (VISTARIL) 25 MG capsule Take 25 mg by mouth as needed. For anxiety PRN 10/07/19   [provider]  metoCLOPramide (REGLAN) 10 MG tablet Take 10 mg by mouth as needed. 06/16/20  [provider]  polyethylene glycol powder (GLYCOLAX/MIRALAX) 17 GM/SCOOP powder Take 1 capful daily in 8 ounces of juice 06/24/20   Zehr, Shanda Bumps D, PA-C    Allergies    Penicillins and Protonix [pantoprazole]  Review of Systems   Review of Systems  Gastrointestinal:  Positive for abdominal pain.  All other systems reviewed and are negative.  Physical Exam Updated Vital Signs BP 110/78   Pulse 78   Temp 97.8 F (36.6 C)   Resp 18   Ht 5\' 1"  (1.549 m)   Wt 122.5 kg   LMP 05/01/2021   SpO2 100%   BMI 51.02 kg/m   Physical  Exam Vitals and nursing note reviewed.  Constitutional:      General: She is not in acute distress.    Appearance: She is well-developed. She is obese.  HENT:     Head: Atraumatic.  Eyes:     Conjunctiva/sclera: Conjunctivae normal.  Cardiovascular:     Rate and Rhythm: Normal rate and regular rhythm.  Pulmonary:     Effort: Pulmonary effort is normal.  Abdominal:     General: Bowel sounds are normal. There is no distension.     Palpations: Abdomen is soft.     Tenderness: There is abdominal tenderness in the periumbilical area and left lower quadrant.  Genitourinary:    Comments: Katie, RN, available to chaperone.  Visible external hemorrhoid with trace of blood noted.  Nonthrombosed hemorrhoid.  No obvious mass, normal rectal tone, normal color stool on glove Musculoskeletal:     Cervical back: Neck supple.  Skin:    Findings: No rash.  Neurological:     Mental Status: She is alert.  Psychiatric:        Mood and Affect: Mood normal.    ED Results / Procedures / Treatments   Labs (all labs ordered are listed, but only abnormal results are displayed) Labs Reviewed  OCCULT BLOOD X 1 CARD TO LAB, STOOL    EKG None  Radiology CT ABDOMEN PELVIS W CONTRAST  Result Date: 05/27/2021 CLINICAL DATA:  Suspected diverticulitis in a 35 year old female. EXAM: CT ABDOMEN AND PELVIS WITH CONTRAST TECHNIQUE: Multidetector CT imaging of the abdomen and pelvis was performed using the standard protocol following bolus administration of intravenous contrast. CONTRAST:  49mL OMNIPAQUE IOHEXOL 350 MG/ML SOLN COMPARISON:  None FINDINGS: Lower chest: Unremarkable. Hepatobiliary: No focal, suspicious hepatic lesion. No pericholecystic stranding. No biliary duct dilation. Portal vein is patent. Signs of suspected hepatic steatosis. Top-normal liver span. Pancreas: Normal, without mass, inflammation or ductal dilatation. Spleen: Spleen normal size and contour. Adrenals/Urinary Tract: Mildly  heterogeneous LEFT adrenal gland measures 2.8 x 2.5 cm but is largely well-circumscribed measuring 39 Hounsfield unit density on venous phase. RIGHT adrenal gland is normal. Symmetric enhancement of the bilateral kidneys. No signs of hydronephrosis. No suspicious renal lesion. Stomach/Bowel: Normal appendix. Scattered diverticulosis without diverticulitis of the sigmoid colon. Stranding about the splenic flexure, distal transverse colon and proximal descending colon. Thickening of the colon in this region as well. No perigastric stranding or acute small bowel process Vascular/Lymphatic: Aorta with smooth contours. IVC with smooth contours. No aneurysmal dilation of the abdominal aorta. There is no gastrohepatic or hepatoduodenal ligament lymphadenopathy. No retroperitoneal or mesenteric lymphadenopathy. No pelvic sidewall lymphadenopathy. Reproductive: Unremarkable by CT. Other: No ascites.  No free air. Musculoskeletal: No acute or destructive bone process. IMPRESSION: Colitis involving the splenic flexure, distal transverse colon and proximal descending colon. Segmental colitis suspicious for infectious or inflammatory colitis;  however, findings are found in a watershed distribution raising the question of ischemic colitis. Abdominal vessels appear patent grossly. Would correlate with lactate and with any medications that might predispose this patient to vasospasm or any episodes of recent hypotension as well as any risk factors for infectious colitis/C difficile such as recent antibiotic administration. There is evidence of diverticulosis, very mild and distribution does not favor diverticulitis. No free air, pneumatosis or ascites Well-circumscribed adrenal gland measuring 2.8 x 2.5 cm with mild heterogeneity. Findings are nonspecific potentially representing adrenal adenoma. Suggest non emergent but prompt adrenal washout CT or chemical shift MRI for further assessment. Electronically Signed   By: Donzetta Kohut M.D.   On: 05/27/2021 15:38    Procedures Procedures   Medications Ordered in ED Medications  ciprofloxacin (CIPRO) IVPB 400 mg (has no administration in time range)  metroNIDAZOLE (FLAGYL) tablet 500 mg (has no administration in time range)  morphine 4 MG/ML injection 4 mg (has no administration in time range)  morphine 4 MG/ML injection 4 mg (4 mg Intravenous Given 05/27/21 1426)  sodium chloride 0.9 % bolus 1,000 mL (1,000 mLs Intravenous New Bag/Given 05/27/21 1424)  ondansetron (ZOFRAN) injection 4 mg (4 mg Intravenous Given 05/27/21 1425)  iohexol (OMNIPAQUE) 350 MG/ML injection 80 mL (80 mLs Intravenous Contrast Given 05/27/21 1455)      ED Course  I have reviewed the triage vital signs and the nursing notes.  Pertinent labs & imaging results that were available during my care of the patient were reviewed by me and considered in my medical decision making (see chart for details).    MDM Rules/Calculators/A&P                           BP 121/85   Pulse 70   Temp 97.8 F (36.6 C)   Resp 17   Ht 5\' 1"  (1.549 m)   Wt 122.5 kg   LMP 05/01/2021   SpO2 100%   BMI 51.02 kg/m   Final Clinical Impression(s) / ED Diagnoses Final diagnoses:  Colitis    Rx / DC Orders ED Discharge Orders          Ordered    ciprofloxacin (CIPRO) 500 MG tablet  Every 12 hours        05/27/21 1748    metroNIDAZOLE (FLAGYL) 500 MG tablet  2 times daily        05/27/21 1748           2:00 PM Patient here with left side abdominal pain and also complaining of seeing blood in her stool.  She does have evidence of external hemorrhoid with some irritated hemorrhoids likely contributing to her rectal bleeding.  However due to her abdominal discomfort, will obtain abdominal pelvic CT scan to rule out acute abdominal pathology.  Labs was obtained earlier in the day, and unremarkable.  Will provide symptom control.  3:55 PM CT scan obtained today demonstrate colitis involving the splenic  flexure distal transverse colon and proximal descending colon.  For this finding, patient will receive Cipro and Flagyl to treat for potential infectious etiology.  However I strong encourage patient to follow-up with GI specialist for further evaluations as I am concerned for potential inflammatory cause such as Crohn's, or ulcerative colitis.  I have low suspicion for C. difficile as patient denies any recent antibiotic use.  Incidentally there is a well-circumscribed adrenal gland measuring 2.8 x 2.5 cm with mild heterogenicity.  This  is likely an adrenal adenoma however encourage patient to have a CT scan or MRI for further assessment.  Patient voiced understanding and agrees with plan.  She still endorse pain, will provide additional symptom control.   Fayrene Helper, PA-C 05/27/21 1751    Pollyann Savoy, MD 05/28/21 [phone]

## 2021-06-02 ENCOUNTER — Encounter: Payer: Self-pay | Admitting: Gastroenterology

## 2021-06-02 ENCOUNTER — Ambulatory Visit (INDEPENDENT_AMBULATORY_CARE_PROVIDER_SITE_OTHER): Payer: BC Managed Care – PPO | Admitting: Gastroenterology

## 2021-06-02 VITALS — BP 120/80 | HR 94 | Ht 61.0 in | Wt 264.4 lb

## 2021-06-02 DIAGNOSIS — R110 Nausea: Secondary | ICD-10-CM

## 2021-06-02 DIAGNOSIS — K625 Hemorrhage of anus and rectum: Secondary | ICD-10-CM | POA: Diagnosis not present

## 2021-06-02 DIAGNOSIS — R933 Abnormal findings on diagnostic imaging of other parts of digestive tract: Secondary | ICD-10-CM

## 2021-06-02 DIAGNOSIS — K219 Gastro-esophageal reflux disease without esophagitis: Secondary | ICD-10-CM

## 2021-06-02 MED ORDER — ONDANSETRON 4 MG PO TBDP: 4.0000 mg | ORAL_TABLET | Freq: Four times a day (QID) | ORAL | 1 refills | Status: DC | PRN

## 2021-06-02 NOTE — Progress Notes (Signed)
06/02/2021 Kelsey Sutton [id number] 1985/09/22   HISTORY OF PRESENT ILLNESS: This is a 35 year old female who is a patient of Dr. Lamar Sprinkles.  She has never been seen by him, has been seen by me on a couple occasions last year.  She was seen here previously for her complaints of GERD and constipation.  In regards to her GERD she has been taking Pepcid 20 mg twice daily as she previously stated that pantoprazole seem to make her feel worse.  When I saw her in December of last year she was scheduled for EGD, but then she says that she got COVID and had to cancel her procedure and then never rescheduled after that.  She is here now with those ongoing issues, but also she was in the emergency department 6 days ago.  She says that obviously she is underlyingly constipated, but every once in a while will have an episode of crampy abdominal pain and diarrhea.  On this occasion it woke her up in the middle the night with bloody diarrhea and crampy abdominal pain.  Symptoms were severe and took her to the emergency department.  CT scan of the abdomen pelvis with contrast showed the following:  Colitis involving the splenic flexure, distal transverse colon and proximal descending colon. Segmental colitis suspicious for infectious or inflammatory colitis; however, findings are found in a watershed distribution raising the question of ischemic colitis. Abdominal vessels appear patent grossly. Would correlate with lactate and with any medications that might predispose this patient to vasospasm or any episodes of recent hypotension as well as any risk factors for infectious colitis/C difficile such as recent antibiotic administration.   There is evidence of diverticulosis, very mild and distribution does not favor diverticulitis.   No free air, pneumatosis or ascites   CBC and CMP were okay.  No stool studies were performed.  They placed her on a 10-day course of Cipro and Flagyl.  She says that after  the ER visit she took 3 Imodium to try to help stop the diarrhea and now she has not had a bowel movement in 6 days.  She has had no further rectal bleeding.  She continues to have abdominal pain and nausea.   Past Medical History:  Diagnosis Date   GERD (gastroesophageal reflux disease)    History reviewed. No pertinent surgical history.  reports that she has never smoked. She has never used smokeless tobacco. She reports that she does not currently use alcohol. No history on file for drug use. family history includes Cancer in her father; Inflammatory bowel disease in her mother; Prostate cancer in her father. Allergies  Allergen Reactions   Penicillins    Protonix [Pantoprazole] Nausea Only      Outpatient Encounter Medications as of 06/02/2021  Medication Sig   amphetamine-dextroamphetamine (ADDERALL XR) 10 MG 24 hr capsule Take 1 capsule (10 mg total) by mouth daily.   amphetamine-dextroamphetamine (ADDERALL XR) 30 MG 24 hr capsule Take 1 capsule (30 mg total) by mouth daily.   busPIRone (BUSPAR) 7.5 MG tablet Take 7.5 mg by mouth 2 (two) times daily.   ciprofloxacin (CIPRO) 500 MG tablet Take 1 tablet (500 mg total) by mouth every 12 (twelve) hours.   cloNIDine (CATAPRES) 0.1 MG tablet Take 0.1 mg by mouth at bedtime as needed.    famotidine (PEPCID) 20 MG tablet Take 1 tablet (20 mg total) by mouth 2 (two) times daily.   hydrochlorothiazide (HYDRODIURIL) 25 MG tablet Take 25 mg by mouth  daily.   HYDROcodone-acetaminophen (NORCO/VICODIN) 5-325 MG tablet Take 1 tablet by mouth every 6 (six) hours as needed for moderate pain.   hydrOXYzine (VISTARIL) 25 MG capsule Take 25 mg by mouth as needed. For anxiety PRN   metoCLOPramide (REGLAN) 10 MG tablet Take 10 mg by mouth as needed.   metroNIDAZOLE (FLAGYL) 500 MG tablet Take 1 tablet (500 mg total) by mouth 2 (two) times daily.   predniSONE (DELTASONE) 10 MG tablet Take 10 mg by mouth as needed.   [DISCONTINUED] polyethylene glycol  powder (GLYCOLAX/MIRALAX) 17 GM/SCOOP powder Take 1 capful daily in 8 ounces of juice   No facility-administered encounter medications on file as of 06/02/2021.     REVIEW OF SYSTEMS  : All other systems reviewed and negative except where noted in the History of Present Illness.   PHYSICAL EXAM: BP 120/80   Pulse 94   Ht 5\' 1"  (1.549 m)   Wt 264 lb 6 oz (119.9 kg)   LMP 05/31/2021   BMI 49.95 kg/m  General: Well developed AA female in no acute distress Head: Normocephalic and atraumatic Eyes:  Sclerae anicteric, conjunctiva pink. Ears: Normal auditory acuity Lungs: Clear throughout to auscultation; no W/R/R. Heart: Regular rate and rhythm; no M/R/G. Abdomen: Soft, non-distended.  BS present.  Mild right sided TTP. Rectal:  Will be done at the time of colonoscopy. Musculoskeletal: Symmetrical with no gross deformities  Skin: No lesions on visible extremities Extremities: No edema  Neurological: Alert oriented x 4, grossly non-focal Psychological:  Alert and cooperative. Normal mood and affect  ASSESSMENT AND PLAN: *GERD and nausea: Was previously scheduled for EGD last year, but she says that she got COVID and did not proceed and then never rescheduled that.  Not currently on a PPI.  Only on Pepcid 20 mg twice daily.  Previously reported that pantoprazole seemed to make her symptoms worse.  We will leave her on the Pepcid twice daily for now.  I will send a prescription for Zofran for her to use as needed for the nausea.  We will schedule for EGD with Dr. 07/31/2021 due to sooner availability. *Sudden onset diarrhea with rectal bleeding and abdominal pain, CT scan showing colitis involving the splenic flexure, the distal transverse colon, and the proximal descending colon suspicious for infectious or inflammatory colitis, however findings are found in the watershed distribution raising the question of ischemic colitis.  Her abdominal vessels were grossly patent.  She tends to be  underlyingly constipated.  She took 3 Imodium to help stop her diarrhea after her ER visit and has not had a bowel movement in 6 days.  She is on Cipro and Flagyl as prescribed by the ER.  She will complete her 10-day course of antibiotics.  I have asked her to begin taking MiraLAX once or twice daily to try to get her bowels moving again.  We will plan for colonoscopy with Dr. Barron Alvine as well.  **The risks, benefits, and alternatives to EGD and colonoscopy were discussed with the patient and she consents to proceed.    CC:  Care, [facility]

## 2021-06-02 NOTE — Patient Instructions (Addendum)
We have sent the following medications to your pharmacy for you to pick up at your convenience: Zofran 4 mg ODT every 4-6 hours as needed for nausea.  Start Miralax 1 capful 1-2 times daily in 8 ounces of liquid.  You have been scheduled for an endoscopy and colonoscopy. Please follow the written instructions given to you at your visit today. Please pick up your prep supplies at the pharmacy within the next 1-3 days. If you use inhalers (even only as needed), please bring them with you on the day of your procedure.  If you are age 79 or older, your body mass index should be between 23-30. Your Body mass index is 49.95 kg/m. If this is out of the aforementioned range listed, please consider follow up with your Primary Care Provider.  If you are age 39 or younger, your body mass index should be between 19-25. Your Body mass index is 49.95 kg/m. If this is out of the aformentioned range listed, please consider follow up with your Primary Care Provider.   __________________________________________________________  The [facility] providers would like to encourage you to use [facility] to communicate with providers for non-urgent requests or questions.  Due to long hold times on the telephone, sending your provider a message by [facility] may be a faster and more efficient way to get a response.  Please allow 48 business hours for a response.  Please remember that this is for non-urgent requests.

## 2021-06-03 NOTE — Progress Notes (Signed)
Assessment and plans noted ?

## 2021-06-03 NOTE — Progress Notes (Signed)
Agree with the assessment and plan as outlined by Doug Sou, PA-C and as reviewed by Dr. Marina Goodell.   Tyrica Afzal, DO, [facility]

## 2021-06-08 ENCOUNTER — Ambulatory Visit (AMBULATORY_SURGERY_CENTER): Payer: BC Managed Care – PPO | Admitting: Gastroenterology

## 2021-06-08 ENCOUNTER — Encounter: Payer: Self-pay | Admitting: Gastroenterology

## 2021-06-08 VITALS — BP 150/96 | HR 96 | Temp 94.0°F | Resp 14 | Ht 61.75 in | Wt 258.0 lb

## 2021-06-08 DIAGNOSIS — K641 Second degree hemorrhoids: Secondary | ICD-10-CM

## 2021-06-08 DIAGNOSIS — R197 Diarrhea, unspecified: Secondary | ICD-10-CM

## 2021-06-08 DIAGNOSIS — K2990 Gastroduodenitis, unspecified, without bleeding: Secondary | ICD-10-CM | POA: Diagnosis not present

## 2021-06-08 DIAGNOSIS — K2970 Gastritis, unspecified, without bleeding: Secondary | ICD-10-CM

## 2021-06-08 DIAGNOSIS — K219 Gastro-esophageal reflux disease without esophagitis: Secondary | ICD-10-CM

## 2021-06-08 DIAGNOSIS — K625 Hemorrhage of anus and rectum: Secondary | ICD-10-CM

## 2021-06-08 DIAGNOSIS — R110 Nausea: Secondary | ICD-10-CM

## 2021-06-08 DIAGNOSIS — R933 Abnormal findings on diagnostic imaging of other parts of digestive tract: Secondary | ICD-10-CM | POA: Diagnosis not present

## 2021-06-08 DIAGNOSIS — R12 Heartburn: Secondary | ICD-10-CM | POA: Diagnosis not present

## 2021-06-08 DIAGNOSIS — K2950 Unspecified chronic gastritis without bleeding: Secondary | ICD-10-CM

## 2021-06-08 DIAGNOSIS — K5730 Diverticulosis of large intestine without perforation or abscess without bleeding: Secondary | ICD-10-CM | POA: Diagnosis not present

## 2021-06-08 MED ORDER — SODIUM CHLORIDE 0.9 % IV SOLN: 500.0000 mL | INTRAVENOUS | Status: DC

## 2021-06-08 NOTE — Progress Notes (Signed)
To pacu, VSS. Report to [email or web address] 

## 2021-06-08 NOTE — Progress Notes (Signed)
VS by JK  Pt's states no medical or surgical changes since previsit or office visit.  

## 2021-06-08 NOTE — Op Note (Signed)
[facility] Patient Name: Kelsey Sutton Procedure Date: 06/08/2021 2:19 PM MRN: [id number] Endoscopist: Doristine Locks , MD Age: 35 Referring MD:  Date of Birth: 1985/09/21 Gender: Female Account #: [id number] Procedure:                Upper GI endoscopy Indications:              Heartburn, Suspected esophageal reflux, Diarrhea,                            Nausea Medicines:                Monitored Anesthesia Care Procedure:                Pre-Anesthesia Assessment:                           - Prior to the procedure, a History and Physical                            was performed, and patient medications and                            allergies were reviewed. The patient's tolerance of                            previous anesthesia was also reviewed. The risks                            and benefits of the procedure and the sedation                            options and risks were discussed with the patient.                            All questions were answered, and informed consent                            was obtained. Prior Anticoagulants: The patient has                            taken no previous anticoagulant or antiplatelet                            agents. ASA Grade Assessment: III - A patient with                            severe systemic disease. After reviewing the risks                            and benefits, the patient was deemed in                            satisfactory condition to undergo the procedure.  After obtaining informed consent, the endoscope was                            passed under direct vision. Throughout the                            procedure, the patient's blood pressure, pulse, and                            oxygen saturations were monitored continuously. The                            GIF [id number] #[id number] was introduced through the                            mouth, and advanced to the second part of  duodenum.                            The upper GI endoscopy was accomplished without                            difficulty. The patient tolerated the procedure                            well. Scope In: Scope Out: Findings:                 A single area of ectopic gastric mucosa was found                            in the upper third of the esophagus.                           The examined esophagus was otherwise normal.                           Minimal inflammation characterized by congestion                            (edema) was found in the gastric body and in the                            gastric antrum. Biopsies were taken with a cold                            forceps for Helicobacter pylori testing. Estimated                            blood loss was minimal.                           The examined duodenum was normal. Complications:            No immediate complications. Estimated Blood Loss:     Estimated blood loss was minimal. Impression:               -  Ectopic gastric mucosa in the upper third of the                            esophagus.                           - Normal esophagus.                           - Minimal, non-ulcerated gastritis characterized by                            edema. Biopsied.                           - Normal examined duodenum. Recommendation:           - Patient has a contact number available for                            emergencies. The signs and symptoms of potential                            delayed complications were discussed with the                            patient. Return to normal activities tomorrow.                            Written discharge instructions were provided to the                            patient.                           - Resume previous diet.                           - Continue present medications.                           - Await pathology results.                           - Colonoscopy today. Doristine Locks, MD 06/08/2021 3:01:38 PM

## 2021-06-08 NOTE — Patient Instructions (Signed)
YOU HAD AN ENDOSCOPIC PROCEDURE TODAY AT THE [facility]:   Refer to the procedure report that was given to you for any specific questions about what was found during the examination.  If the procedure report does not answer your questions, please call your gastroenterologist to clarify.  If you requested that your care partner not be given the details of your procedure findings, then the procedure report has been included in a sealed envelope for you to review at your convenience later.  YOU SHOULD EXPECT: Some feelings of bloating in the abdomen. Passage of more gas than usual.  Walking can help get rid of the air that was put into your GI tract during the procedure and reduce the bloating. If you had a lower endoscopy (such as a colonoscopy or flexible sigmoidoscopy) you may notice spotting of blood in your stool or on the toilet paper. If you underwent a bowel prep for your procedure, you may not have a normal bowel movement for a few days.  Please Note:  You might notice some irritation and congestion in your nose or some drainage.  This is from the oxygen used during your procedure.  There is no need for concern and it should clear up in a day or so.  SYMPTOMS TO REPORT IMMEDIATELY:   Following lower endoscopy (colonoscopy or flexible sigmoidoscopy):  Excessive amounts of blood in the stool  Significant tenderness or worsening of abdominal pains  Swelling of the abdomen that is new, acute  Fever of 100F or higher   Following upper endoscopy (EGD)  Vomiting of blood or coffee ground material  New chest pain or pain under the shoulder blades  Painful or persistently difficult swallowing  New shortness of breath  Fever of 100F or higher  Black, tarry-looking stools  For urgent or emergent issues, a gastroenterologist can be reached at any hour by calling [phone]. Do not use MyChart messaging for urgent concerns.    DIET:  We do recommend a small meal at first, but  then you may proceed to your regular diet.  Drink plenty of fluids but you should avoid alcoholic beverages for 24 hours.  ACTIVITY:  You should plan to take it easy for the rest of today and you should NOT DRIVE or use heavy machinery until tomorrow (because of the sedation medicines used during the test).    FOLLOW UP: Our staff will call the number listed on your records 48-72 hours following your procedure to check on you and address any questions or concerns that you may have regarding the information given to you following your procedure. If we do not reach you, we will leave a message.  We will attempt to reach you two times.  During this call, we will ask if you have developed any symptoms of COVID 19. If you develop any symptoms (ie: fever, flu-like symptoms, shortness of breath, cough etc.) before then, please call [phone].  If you test positive for Covid 19 in the 2 weeks post procedure, please call and report this information to us.    If any biopsies were taken you will be contacted by phone or by letter within the next 1-3 weeks.  Please call us at [phone] if you have not heard about the biopsies in 3 weeks.    SIGNATURES/CONFIDENTIALITY: You and/or your care partner have signed paperwork which will be entered into your electronic medical record.  These signatures attest to the fact that that the information above on   your After Visit Summary has been reviewed and is understood.  Full responsibility of the confidentiality of this discharge information lies with you and/or your care-partner. 

## 2021-06-08 NOTE — Op Note (Signed)
[facility] Patient Name: Kelsey Sutton Procedure Date: 06/08/2021 2:19 PM MRN: [id number] Endoscopist: Doristine Locks , MD Age: 35 Referring MD:  Date of Birth: 1986-07-25 Gender: Female Account #: [id number] Procedure:                Colonoscopy Indications:              Abnormal CT of the GI tract (Colitis on CT),                            Diarrhea Medicines:                Monitored Anesthesia Care Procedure:                Pre-Anesthesia Assessment:                           - Prior to the procedure, a History and Physical                            was performed, and patient medications and                            allergies were reviewed. The patient's tolerance of                            previous anesthesia was also reviewed. The risks                            and benefits of the procedure and the sedation                            options and risks were discussed with the patient.                            All questions were answered, and informed consent                            was obtained. Prior Anticoagulants: The patient has                            taken no previous anticoagulant or antiplatelet                            agents. ASA Grade Assessment: III - A patient with                            severe systemic disease. After reviewing the risks                            and benefits, the patient was deemed in                            satisfactory condition to undergo the procedure.  After obtaining informed consent, the colonoscope                            was passed under direct vision. Throughout the                            procedure, the patient's blood pressure, pulse, and                            oxygen saturations were monitored continuously. The                            CF [id number] #[id number] was introduced through the anus                            and advanced to the the terminal ileum. The                             colonoscopy was performed without difficulty. The                            patient tolerated the procedure well. The quality                            of the bowel preparation was good. The terminal                            ileum, ileocecal valve, appendiceal orifice, and                            rectum were photographed. Scope In: 2:41:19 PM Scope Out: 2:55:40 PM Scope Withdrawal Time: 0 hours 11 minutes 37 seconds  Total Procedure Duration: 0 hours 14 minutes 21 seconds  Findings:                 Hemorrhoids were found on perianal exam.                           Multiple small and large-mouthed diverticula were                            found in the sigmoid colon.                           Normal mucosa was found in the entire colon.                            Biopsies for histology were taken with a cold                            forceps from the right colon and left colon for                            evaluation of microscopic colitis. Estimated blood  loss was minimal.                           Non-bleeding internal hemorrhoids were found during                            retroflexion. The hemorrhoids were small and Grade                            II (internal hemorrhoids that prolapse but reduce                            spontaneously).                           The terminal ileum appeared normal. Complications:            No immediate complications. Estimated Blood Loss:     Estimated blood loss was minimal. Impression:               - Hemorrhoids found on perianal exam.                           - Diverticulosis in the sigmoid colon.                           - Normal mucosa in the entire examined colon.                            Biopsied.                           - Non-bleeding internal hemorrhoids.                           - The examined portion of the ileum was normal. Recommendation:           - Patient has a  contact number available for                            emergencies. The signs and symptoms of potential                            delayed complications were discussed with the                            patient. Return to normal activities tomorrow.                            Written discharge instructions were provided to the                            patient.                           - Resume previous diet.                           -  Continue present medications.                           - Await pathology results.                           - Repeat colonoscopy at age 17 for screening                            purposes.                           - Return to GI office PRN.                           - Use fiber, for example Citrucel, Fibercon, Konsyl                            or Metamucil. Doristine Locks, MD 06/08/2021 3:08:38 PM

## 2021-06-08 NOTE — Progress Notes (Signed)
GASTROENTEROLOGY PROCEDURE H&P NOTE   Primary Care Physician: [facility]    Reason for Procedure:  GERD, nausea, diarrhea, hematochezia, colitis on CT  Plan:    EGD, colonoscopy  Patient is appropriate for endoscopic procedure(s) in the ambulatory ([facility]) setting.  The nature of the procedure, as well as the risks, benefits, and alternatives were carefully and thoroughly reviewed with the patient. Ample time for discussion and questions allowed. The patient understood, was satisfied, and agreed to proceed.     HPI: Kelsey Sutton is a 35 y.o. female who presents for EGD and colonoscopy for evaluation of multiple GI symptoms to include GERD, nausea, diarrhea/change in bowel habits, hematochezia, colitis on CT.  Patient was most recently seen in the Gastroenterology Clinic on 06/02/2021 by Arletha Pili, PA-C.  No interval change in medical history since that appointment. Please refer to that note for full details regarding GI history and clinical presentation.   Past Medical History:  Diagnosis Date   Anxiety    GERD (gastroesophageal reflux disease)    Hyperlipidemia    Hypertension     Past Surgical History:  Procedure Laterality Date   CHALAZION EXCISION Right 2011   DILATION AND CURETTAGE OF UTERUS  2015    Prior to Admission medications   Medication Sig Start Date End Date Taking? Authorizing Provider  amphetamine-dextroamphetamine (ADDERALL XR) 30 MG 24 hr capsule Take 1 capsule (30 mg total) by mouth daily. 01/27/20  Yes Moshe Cipro, NP  busPIRone (BUSPAR) 7.5 MG tablet Take 7.5 mg by mouth 2 (two) times daily. 05/04/21  Yes [provider]  ciprofloxacin (CIPRO) 500 MG tablet Take 1 tablet (500 mg total) by mouth every 12 (twelve) hours. 05/27/21  Yes Fayrene Helper, PA-C  cloNIDine (CATAPRES) 0.1 MG tablet Take 0.1 mg by mouth at bedtime as needed.    Yes [provider]  HYDROcodone-acetaminophen (NORCO/VICODIN) 5-325 MG tablet  Take 1 tablet by mouth every 6 (six) hours as needed for moderate pain. 05/27/21  Yes Fayrene Helper, PA-C  hydrOXYzine (VISTARIL) 25 MG capsule Take 25 mg by mouth as needed. For anxiety PRN 10/07/19  Yes [provider]  metroNIDAZOLE (FLAGYL) 500 MG tablet Take 1 tablet (500 mg total) by mouth 2 (two) times daily. 05/27/21  Yes Fayrene Helper, PA-C  amphetamine-dextroamphetamine (ADDERALL XR) 10 MG 24 hr capsule Take 1 capsule (10 mg total) by mouth daily. 01/27/20   Moshe Cipro, NP  famotidine (PEPCID) 20 MG tablet Take 1 tablet (20 mg total) by mouth 2 (two) times daily. 06/24/20   Zehr, Princella Pellegrini, PA-C  hydrochlorothiazide (HYDRODIURIL) 25 MG tablet Take 25 mg by mouth daily.    [provider]  metoCLOPramide (REGLAN) 10 MG tablet Take 10 mg by mouth as needed. Patient not taking: Reported on 06/08/2021 06/16/20   [provider]  ondansetron (ZOFRAN ODT) 4 MG disintegrating tablet Take 1 tablet (4 mg total) by mouth every 6 (six) hours as needed for nausea or vomiting. Patient not taking: Reported on 06/08/2021 06/02/21   Zehr, Princella Pellegrini, PA-C  predniSONE (DELTASONE) 10 MG tablet Take 10 mg by mouth as needed. Patient not taking: Reported on 06/08/2021 01/19/21   [provider]    Current Outpatient Medications  Medication Sig Dispense Refill   amphetamine-dextroamphetamine (ADDERALL XR) 30 MG 24 hr capsule Take 1 capsule (30 mg total) by mouth daily. 30 capsule 0   busPIRone (BUSPAR) 7.5 MG tablet Take 7.5 mg by mouth 2 (two) times daily.  ciprofloxacin (CIPRO) 500 MG tablet Take 1 tablet (500 mg total) by mouth every 12 (twelve) hours. 20 tablet 0   cloNIDine (CATAPRES) 0.1 MG tablet Take 0.1 mg by mouth at bedtime as needed.      HYDROcodone-acetaminophen (NORCO/VICODIN) 5-325 MG tablet Take 1 tablet by mouth every 6 (six) hours as needed for moderate pain. 15 tablet 0   hydrOXYzine (VISTARIL) 25 MG capsule Take 25 mg by mouth as needed. For anxiety  PRN     metroNIDAZOLE (FLAGYL) 500 MG tablet Take 1 tablet (500 mg total) by mouth 2 (two) times daily. 20 tablet 0   amphetamine-dextroamphetamine (ADDERALL XR) 10 MG 24 hr capsule Take 1 capsule (10 mg total) by mouth daily. 30 capsule 0   famotidine (PEPCID) 20 MG tablet Take 1 tablet (20 mg total) by mouth 2 (two) times daily. 60 tablet 5   hydrochlorothiazide (HYDRODIURIL) 25 MG tablet Take 25 mg by mouth daily.     metoCLOPramide (REGLAN) 10 MG tablet Take 10 mg by mouth as needed. (Patient not taking: Reported on 06/08/2021)     ondansetron (ZOFRAN ODT) 4 MG disintegrating tablet Take 1 tablet (4 mg total) by mouth every 6 (six) hours as needed for nausea or vomiting. (Patient not taking: Reported on 06/08/2021) 30 tablet 1   predniSONE (DELTASONE) 10 MG tablet Take 10 mg by mouth as needed. (Patient not taking: Reported on 06/08/2021)     Current Facility-Administered Medications  Medication Dose Route Frequency Provider Last Rate Last Admin   0.9 %  sodium chloride infusion  500 mL Intravenous Continuous Elvina Bosch V, DO        Allergies as of 06/08/2021 - Review Complete 06/08/2021  Allergen Reaction Noted   Penicillins  10/05/2018   Protonix [pantoprazole] Nausea Only 06/24/2020    Family History  Problem Relation Age of Onset   Cancer Father    Prostate cancer Father    Inflammatory bowel disease Mother    Colon cancer Neg Hx    Pancreatic cancer Neg Hx    Esophageal cancer Neg Hx     Social History   Socioeconomic History   Marital status: Single    Spouse name: Not on file   Number of children: 1   Years of education: Not on file   Highest education level: Not on file  Occupational History   Not on file  Tobacco Use   Smoking status: Never   Smokeless tobacco: Never  Vaping Use   Vaping Use: Former  Substance and Sexual Activity   Alcohol use: Not Currently   Drug use: Not on file   Sexual activity: Not on file  Other Topics Concern   Not on file   Social History Narrative   Not on file   Social Determinants of Health   Financial Resource Strain: Not on file  Food Insecurity: Not on file  Transportation Needs: Not on file  Physical Activity: Not on file  Stress: Not on file  Social Connections: Not on file  Intimate Partner Violence: Not on file    Physical Exam: Vital signs in last 24 hours: @BP  (!) 158/98   Pulse 92   Temp 98 F (36.7 C)   Ht 5' 1.75" (1.568 m)   Wt 258 lb (117 kg)   LMP 05/31/2021   SpO2 99%   BMI 47.57 kg/m  GEN: NAD EYE: Sclerae anicteric ENT: MMM CV: Non-tachycardic Pulm: CTA b/l GI: Soft, NT/ND NEURO:  Alert & Oriented x 3  Doristine Locks, DO [facility]   06/08/2021 2:21 PM

## 2021-06-10 ENCOUNTER — Telehealth: Payer: Self-pay | Admitting: *Deleted

## 2021-06-10 NOTE — Telephone Encounter (Signed)
  Follow up Call-  Call back number 06/08/2021  Post procedure Call Back phone  # [phone]  Permission to leave phone message Yes     Patient questions:  Do you have a fever, pain , or abdominal swelling? No. Pain Score  0 *  Have you tolerated food without any problems? Yes.    Have you been able to return to your normal activities? Yes.    Do you have any questions about your discharge instructions: Diet   No. Medications  No. Follow up visit  No.  Do you have questions or concerns about your Care? No.  Actions: * If pain score is 4 or above: No action needed, pain <4.

## 2021-06-18 ENCOUNTER — Telehealth: Payer: Self-pay | Admitting: General Surgery

## 2021-06-18 DIAGNOSIS — K2970 Gastritis, unspecified, without bleeding: Secondary | ICD-10-CM

## 2021-06-18 DIAGNOSIS — B9681 Helicobacter pylori [H. pylori] as the cause of diseases classified elsewhere: Secondary | ICD-10-CM

## 2021-06-18 MED ORDER — OMEPRAZOLE 20 MG PO CPDR: 20.0000 mg | DELAYED_RELEASE_CAPSULE | Freq: Two times a day (BID) | ORAL | 0 refills | Status: DC

## 2021-06-18 MED ORDER — BISMUTH SUBSALICYLATE 262 MG PO CHEW: 524.0000 mg | CHEWABLE_TABLET | Freq: Four times a day (QID) | ORAL | 0 refills | Status: AC

## 2021-06-18 MED ORDER — DOXYCYCLINE HYCLATE 100 MG PO CAPS: 100.0000 mg | ORAL_CAPSULE | Freq: Two times a day (BID) | ORAL | 0 refills | Status: AC

## 2021-06-18 MED ORDER — METRONIDAZOLE 250 MG PO TABS: 250.0000 mg | ORAL_TABLET | Freq: Four times a day (QID) | ORAL | 0 refills | Status: AC

## 2021-06-18 NOTE — Telephone Encounter (Signed)
-----   Message from [facility] V, DO sent at 06/17/2021  8:12 AM EDT ----- Biopsies from recent colonoscopy and upper endoscopy are as follows: -The biopsies taken from your colon were normal and there was no evidence of Microscopic Colitis or chronic inflammatory changes.   -The biopsies from the recent upper GI Endoscopy were notable for H. Pylori gastritis, and will plan on treating with quad therapy as below. Please confirm no medication allergies to the prescribed regimen.   1) Omeprazole 20 mg 2 times a day x 14 d 2) Pepto Bismol 2 tabs (262 mg each) 4 times a day x 14 d 3) Metronidazole 250 mg 4 times a day x 14 d 4) doxycycline 100 mg 2 times a day x 14 d  After 14 days, ok to stop omeprazole.  4 weeks after treatment completed, check H. Pylori stool antigen to confirm eradication (must be off acid suppression therapy)  Dx: H. Pylori gastritis

## 2021-06-18 NOTE — Telephone Encounter (Signed)
Left a voicemail for the patient to contact the clinic regarding results. Expressed that I would also send a mychart message in regards to this.  Place orders for Quad therapy to erradicate h. Pylori and to test stool antigen 4 weeks after completion of her medication.

## 2021-06-22 ENCOUNTER — Ambulatory Visit: Payer: BC Managed Care – PPO | Admitting: Nurse Practitioner

## 2021-06-25 ENCOUNTER — Other Ambulatory Visit: Payer: Self-pay

## 2021-06-25 DIAGNOSIS — T368X5A Adverse effect of other systemic antibiotics, initial encounter: Secondary | ICD-10-CM | POA: Diagnosis not present

## 2021-06-25 DIAGNOSIS — R110 Nausea: Secondary | ICD-10-CM | POA: Diagnosis not present

## 2021-06-25 DIAGNOSIS — T478X5A Adverse effect of other agents primarily affecting gastrointestinal system, initial encounter: Secondary | ICD-10-CM | POA: Diagnosis not present

## 2021-06-25 DIAGNOSIS — Z79899 Other long term (current) drug therapy: Secondary | ICD-10-CM | POA: Diagnosis not present

## 2021-06-25 DIAGNOSIS — R1084 Generalized abdominal pain: Secondary | ICD-10-CM | POA: Diagnosis present

## 2021-06-25 DIAGNOSIS — T4795XA Adverse effect of unspecified agents primarily affecting the gastrointestinal system, initial encounter: Secondary | ICD-10-CM | POA: Diagnosis not present

## 2021-06-25 DIAGNOSIS — F419 Anxiety disorder, unspecified: Secondary | ICD-10-CM | POA: Diagnosis not present

## 2021-06-25 DIAGNOSIS — T364X5A Adverse effect of tetracyclines, initial encounter: Secondary | ICD-10-CM | POA: Diagnosis not present

## 2021-06-25 DIAGNOSIS — I10 Essential (primary) hypertension: Secondary | ICD-10-CM | POA: Insufficient documentation

## 2021-06-25 NOTE — ED Triage Notes (Signed)
Pt dx with H. Pylori, given Rx that she says makes abd pain worse. C/o abd pain, ShOB, anxiety.

## 2021-06-26 ENCOUNTER — Emergency Department (HOSPITAL_BASED_OUTPATIENT_CLINIC_OR_DEPARTMENT_OTHER)
Admission: EM | Admit: 2021-06-26 | Discharge: 2021-06-26 | Disposition: A | Discharge status: Home / Self Care | Payer: BC Managed Care – PPO | Attending: Emergency Medicine | Admitting: Emergency Medicine

## 2021-06-26 DIAGNOSIS — T50905A Adverse effect of unspecified drugs, medicaments and biological substances, initial encounter: Secondary | ICD-10-CM

## 2021-06-26 DIAGNOSIS — R1084 Generalized abdominal pain: Secondary | ICD-10-CM | POA: Diagnosis not present

## 2021-06-26 LAB — CBC WITH DIFFERENTIAL/PLATELET
Abs Immature Granulocytes: 0.04 10*3/uL (ref 0.00–0.07)
Basophils Absolute: 0.1 10*3/uL (ref 0.0–0.1)
Basophils Relative: 1 %
Eosinophils Absolute: 0.1 10*3/uL (ref 0.0–0.5)
Eosinophils Relative: 1 %
HCT: 37.3 % (ref 36.0–46.0)
Hemoglobin: 11.6 g/dL — ABNORMAL LOW (ref 12.0–15.0)
Immature Granulocytes: 0 %
Lymphocytes Relative: 29 %
Lymphs Abs: 2.9 10*3/uL (ref 0.7–4.0)
MCH: 24.6 pg — ABNORMAL LOW (ref 26.0–34.0)
MCHC: 31.1 g/dL (ref 30.0–36.0)
MCV: 79 fL — ABNORMAL LOW (ref 80.0–100.0)
Monocytes Absolute: 0.8 10*3/uL (ref 0.1–1.0)
Monocytes Relative: 8 %
Neutro Abs: 6.1 10*3/uL (ref 1.7–7.7)
Neutrophils Relative %: 61 %
Platelets: 415 10*3/uL — ABNORMAL HIGH (ref 150–400)
RBC: 4.72 MIL/uL (ref 3.87–5.11)
RDW: 16.3 % — ABNORMAL HIGH (ref 11.5–15.5)
WBC: 10 10*3/uL (ref 4.0–10.5)
nRBC: 0 % (ref 0.0–0.2)

## 2021-06-26 LAB — COMPREHENSIVE METABOLIC PANEL
ALT: 10 U/L (ref 0–44)
AST: 11 U/L — ABNORMAL LOW (ref 15–41)
Albumin: 3.7 g/dL (ref 3.5–5.0)
Alkaline Phosphatase: 59 U/L (ref 38–126)
Anion gap: 9 (ref 5–15)
BUN: 6 mg/dL (ref 6–20)
CO2: 26 mmol/L (ref 22–32)
Calcium: 9.1 mg/dL (ref 8.9–10.3)
Chloride: 104 mmol/L (ref 98–111)
Creatinine, Ser: 0.55 mg/dL (ref 0.44–1.00)
GFR, Estimated: >60 mL/min (ref >60)
Glucose, Bld: 101 mg/dL — ABNORMAL HIGH (ref 70–99)
Potassium: 3.7 mmol/L (ref 3.5–5.1)
Sodium: 139 mmol/L (ref 135–145)
Total Bilirubin: 0.6 mg/dL (ref 0.3–1.2)
Total Protein: 7 g/dL (ref 6.5–8.1)

## 2021-06-26 LAB — LIPASE, BLOOD: Lipase: 23 U/L (ref 11–51)

## 2021-06-26 MED ORDER — SUCRALFATE 1 GM/10ML PO SUSP
1.0000 g | Freq: Once | ORAL | Status: AC
  Administered 2021-06-26: 1 g via ORAL
  Filled 2021-06-26: qty 10

## 2021-06-26 MED ORDER — FAMOTIDINE IN NACL 20-0.9 MG/50ML-% IV SOLN
20.0000 mg | Freq: Once | INTRAVENOUS | Status: AC
  Administered 2021-06-26: 20 mg via INTRAVENOUS
  Filled 2021-06-26: qty 50

## 2021-06-26 MED ORDER — LORAZEPAM 2 MG/ML IJ SOLN
1.0000 mg | Freq: Once | INTRAMUSCULAR | Status: AC
  Administered 2021-06-26: 1 mg via INTRAVENOUS
  Filled 2021-06-26: qty 1

## 2021-06-26 NOTE — ED Provider Notes (Signed)
[facility] Provider Note: Lowella Dell, MD, FACEP  CSN: [id number] MRN: [id number] ARRIVAL: 06/25/21 at 1940 ROOM: DB013/DB013   CHIEF COMPLAINT  Abdominal Pain and Anxiety   HISTORY OF PRESENT ILLNESS  06/26/21 1:11 AM Kelsey Sutton is a 35 y.o. female who was diagnosed with Helicobacter pylori gastritis by biopsy done 06/08/2021.  She was started on omeprazole, Pepto-Bismol, metronidazole and doxycycline yesterday.  Since beginning these medications she has had generalized abdominal pain which she describes as cramping, achy and "acidy".  She has had associated anxiety and nausea.  The nausea was improved with Reglan.  She also took Imodium to prevent diarrhea.  She thinks the omeprazole is the cause of her symptoms because "PPIs and I have never gotten along together".   Past Medical History:  Diagnosis Date   Anxiety    GERD (gastroesophageal reflux disease)    Hyperlipidemia    Hypertension     Past Surgical History:  Procedure Laterality Date   CHALAZION EXCISION Right 2011   DILATION AND CURETTAGE OF UTERUS  2015    Family History  Problem Relation Age of Onset   Cancer Father    Prostate cancer Father    Inflammatory bowel disease Mother    Colon cancer Neg Hx    Pancreatic cancer Neg Hx    Esophageal cancer Neg Hx     Social History   Tobacco Use   Smoking status: Never   Smokeless tobacco: Never  Vaping Use   Vaping Use: Former  Substance Use Topics   Alcohol use: Not Currently    Prior to Admission medications   Medication Sig Start Date End Date Taking? Authorizing Provider  amphetamine-dextroamphetamine (ADDERALL XR) 10 MG 24 hr capsule Take 1 capsule (10 mg total) by mouth daily. 01/27/20   Moshe Cipro, NP  amphetamine-dextroamphetamine (ADDERALL XR) 30 MG 24 hr capsule Take 1 capsule (30 mg total) by mouth daily. 01/27/20   Moshe Cipro, NP  bismuth subsalicylate (PEPTO-BISMOL) 262 MG chewable tablet Chew 2 tablets (524  mg total) by mouth 4 (four) times daily for 14 days. 06/18/21 07/02/21  Cirigliano, Vito V, DO  busPIRone (BUSPAR) 7.5 MG tablet Take 7.5 mg by mouth 2 (two) times daily. 05/04/21   [provider]  cloNIDine (CATAPRES) 0.1 MG tablet Take 0.1 mg by mouth at bedtime as needed.     [provider]  doxycycline (VIBRAMYCIN) 100 MG capsule Take 1 capsule (100 mg total) by mouth 2 (two) times daily for 14 days. 06/18/21 07/02/21  Cirigliano, Vito V, DO  famotidine (PEPCID) 20 MG tablet Take 1 tablet (20 mg total) by mouth 2 (two) times daily. 06/24/20   Zehr, Princella Pellegrini, PA-C  hydrochlorothiazide (HYDRODIURIL) 25 MG tablet Take 25 mg by mouth daily.    [provider]  HYDROcodone-acetaminophen (NORCO/VICODIN) 5-325 MG tablet Take 1 tablet by mouth every 6 (six) hours as needed for moderate pain. 05/27/21   Fayrene Helper, PA-C  hydrOXYzine (VISTARIL) 25 MG capsule Take 25 mg by mouth as needed. For anxiety PRN 10/07/19   [provider]  metroNIDAZOLE (FLAGYL) 250 MG tablet Take 1 tablet (250 mg total) by mouth 4 (four) times daily for 14 days. 06/18/21 07/02/21  Cirigliano, Vito V, DO  omeprazole (PRILOSEC) 20 MG capsule Take 1 capsule (20 mg total) by mouth 2 (two) times daily before a meal for 14 days. 06/18/21 07/02/21  Cirigliano, Vito V, DO    Allergies Penicillins and Protonix [pantoprazole]   REVIEW OF  SYSTEMS  Negative except as noted here or in the History of Present Illness.   PHYSICAL EXAMINATION  Initial Vital Signs Blood pressure 125/74, pulse 86, temperature 97.6 F (36.4 C), temperature source Oral, resp. rate 20, height 5\' 1"  (1.549 m), weight 115.7 kg, last menstrual period 06/23/2021, SpO2 100 %.  Examination General: Well-developed, well-nourished female in no acute distress; appearance consistent with age of record HENT: normocephalic; atraumatic Eyes: pupils equal, round and reactive to light; extraocular muscles intact Neck: supple Heart:  regular rate and rhythm Lungs: clear to auscultation bilaterally Abdomen: soft; nondistended; mild diffuse tenderness; bowel sounds present Extremities: No deformity; full range of motion; pulses normal Neurologic: Awake, alert and oriented; motor function intact in all extremities and symmetric; no facial droop Skin: Warm and dry Psychiatric: Anxious   RESULTS  Summary of this visit's results, reviewed and interpreted by myself:   EKG Interpretation  Date/Time:    Ventricular Rate:    PR Interval:    QRS Duration:   QT Interval:    QTC Calculation:   R Axis:     Text Interpretation:         Laboratory Studies: Results for orders placed or performed during the hospital encounter of 06/26/21 (from the past 24 hour(s))  CBC with Differential/Platelet     Status: Abnormal   Collection Time: 06/26/21  1:22 AM  Result Value Ref Range   WBC 10.0 4.0 - 10.5 K/uL   RBC 4.72 3.87 - 5.11 MIL/uL   Hemoglobin 11.6 (L) 12.0 - 15.0 g/dL   HCT 13/05/22 24.2 - 68.3 %   MCV 79.0 (L) 80.0 - 100.0 fL   MCH 24.6 (L) 26.0 - 34.0 pg   MCHC 31.1 30.0 - 36.0 g/dL   RDW 41.9 (H) 62.2 - 29.7 %   Platelets 415 (H) 150 - 400 K/uL   nRBC 0.0 0.0 - 0.2 %   Neutrophils Relative % 61 %   Neutro Abs 6.1 1.7 - 7.7 K/uL   Lymphocytes Relative 29 %   Lymphs Abs 2.9 0.7 - 4.0 K/uL   Monocytes Relative 8 %   Monocytes Absolute 0.8 0.1 - 1.0 K/uL   Eosinophils Relative 1 %   Eosinophils Absolute 0.1 0.0 - 0.5 K/uL   Basophils Relative 1 %   Basophils Absolute 0.1 0.0 - 0.1 K/uL   Immature Granulocytes 0 %   Abs Immature Granulocytes 0.04 0.00 - 0.07 K/uL  Comprehensive metabolic panel     Status: Abnormal   Collection Time: 06/26/21  1:22 AM  Result Value Ref Range   Sodium 139 135 - 145 mmol/L   Potassium 3.7 3.5 - 5.1 mmol/L   Chloride 104 98 - 111 mmol/L   CO2 26 22 - 32 mmol/L   Glucose, Bld 101 (H) 70 - 99 mg/dL   BUN 6 6 - 20 mg/dL   Creatinine, Ser 13/05/22 0.44 - 1.00 mg/dL   Calcium 9.1 8.9  - 2.11 mg/dL   Total Protein 7.0 6.5 - 8.1 g/dL   Albumin 3.7 3.5 - 5.0 g/dL   AST 11 (L) 15 - 41 U/L   ALT 10 0 - 44 U/L   Alkaline Phosphatase 59 38 - 126 U/L   Total Bilirubin 0.6 0.3 - 1.2 mg/dL   GFR, Estimated 94.1 >74 mL/min   Anion gap 9 5 - 15  Lipase, blood     Status: None   Collection Time: 06/26/21  1:22 AM  Result Value Ref Range   Lipase  23 11 - 51 U/L   Imaging Studies: No results found.  ED COURSE and MDM  Nursing notes, initial and subsequent vitals signs, including pulse oximetry, reviewed and interpreted by myself.  Vitals:   06/25/21 1956 06/25/21 1957 06/26/21 0102 06/26/21 0130  BP: 132/90  125/74 126/86  Pulse: (!) 102  86 76  Resp: 15  20 18   Temp: 97.6 F (36.4 C)     TempSrc: Oral     SpO2: 99%  100% 100%  Weight:  115.7 kg    Height:  5\' 1"  (1.549 m)     Medications  famotidine (PEPCID) IVPB 20 mg premix (20 mg Intravenous New Bag/Given 06/26/21 0142)  sucralfate (CARAFATE) 1 GM/10ML suspension 1 g (1 g Oral Given 06/26/21 0143)  LORazepam (ATIVAN) injection 1 mg (1 mg Intravenous Given 06/26/21 0200)   2:31 AM Patient resting calmly after IV Ativan and oral Carafate.  Patient advised of reassuring lab work.  She was advised to discontinue her H. pylori drugs pending discussion with her prescribing physician.   PROCEDURES  Procedures   ED DIAGNOSES     ICD-10-CM   1. Adverse effect of drug, initial encounter  T50.905A          Rehan Holness, 13/5/22, MD 06/26/21 [phone]

## 2021-07-08 ENCOUNTER — Emergency Department (HOSPITAL_COMMUNITY): Payer: BC Managed Care – PPO

## 2021-07-08 ENCOUNTER — Ambulatory Visit
Admission: EM | Admit: 2021-07-08 | Discharge: 2021-07-08 | Disposition: A | Discharge status: Home / Self Care | Payer: BC Managed Care – PPO

## 2021-07-08 ENCOUNTER — Emergency Department (HOSPITAL_COMMUNITY)
Admission: EM | Admit: 2021-07-08 | Discharge: 2021-07-09 | Disposition: A | Discharge status: Home / Self Care | Payer: BC Managed Care – PPO | Attending: Emergency Medicine | Admitting: Emergency Medicine

## 2021-07-08 ENCOUNTER — Encounter (HOSPITAL_COMMUNITY): Payer: Self-pay

## 2021-07-08 ENCOUNTER — Other Ambulatory Visit: Payer: Self-pay

## 2021-07-08 DIAGNOSIS — K921 Melena: Secondary | ICD-10-CM | POA: Diagnosis not present

## 2021-07-08 DIAGNOSIS — I10 Essential (primary) hypertension: Secondary | ICD-10-CM | POA: Insufficient documentation

## 2021-07-08 DIAGNOSIS — K922 Gastrointestinal hemorrhage, unspecified: Secondary | ICD-10-CM

## 2021-07-08 DIAGNOSIS — R109 Unspecified abdominal pain: Secondary | ICD-10-CM

## 2021-07-08 DIAGNOSIS — R1084 Generalized abdominal pain: Secondary | ICD-10-CM | POA: Diagnosis present

## 2021-07-08 DIAGNOSIS — Z79899 Other long term (current) drug therapy: Secondary | ICD-10-CM | POA: Insufficient documentation

## 2021-07-08 LAB — COMPREHENSIVE METABOLIC PANEL
ALT: 16 U/L (ref 0–44)
AST: 16 U/L (ref 15–41)
Albumin: 3.9 g/dL (ref 3.5–5.0)
Alkaline Phosphatase: 76 U/L (ref 38–126)
Anion gap: 8 (ref 5–15)
BUN: <5 mg/dL — ABNORMAL LOW (ref 6–20)
CO2: 26 mmol/L (ref 22–32)
Calcium: 9.2 mg/dL (ref 8.9–10.3)
Chloride: 104 mmol/L (ref 98–111)
Creatinine, Ser: 0.69 mg/dL (ref 0.44–1.00)
GFR, Estimated: >60 mL/min (ref >60)
Glucose, Bld: 84 mg/dL (ref 70–99)
Potassium: 3.7 mmol/L (ref 3.5–5.1)
Sodium: 138 mmol/L (ref 135–145)
Total Bilirubin: 0.9 mg/dL (ref 0.3–1.2)
Total Protein: 7.7 g/dL (ref 6.5–8.1)

## 2021-07-08 LAB — LIPASE, BLOOD: Lipase: 25 U/L (ref 11–51)

## 2021-07-08 LAB — URINALYSIS, ROUTINE W REFLEX MICROSCOPIC
Bilirubin Urine: NEGATIVE
Glucose, UA: NEGATIVE mg/dL
Hgb urine dipstick: NEGATIVE
Ketones, ur: 5 mg/dL — AB
Leukocytes,Ua: NEGATIVE
Nitrite: NEGATIVE
Protein, ur: NEGATIVE mg/dL
Specific Gravity, Urine: 1.018 (ref 1.005–1.030)
pH: 5 (ref 5.0–8.0)

## 2021-07-08 LAB — CBC WITH DIFFERENTIAL/PLATELET
Abs Immature Granulocytes: 0.03 10*3/uL (ref 0.00–0.07)
Basophils Absolute: 0.1 10*3/uL (ref 0.0–0.1)
Basophils Relative: 1 %
Eosinophils Absolute: 0.1 10*3/uL (ref 0.0–0.5)
Eosinophils Relative: 1 %
HCT: 41.6 % (ref 36.0–46.0)
Hemoglobin: 12.7 g/dL (ref 12.0–15.0)
Immature Granulocytes: 1 %
Lymphocytes Relative: 33 %
Lymphs Abs: 2.1 10*3/uL (ref 0.7–4.0)
MCH: 25.1 pg — ABNORMAL LOW (ref 26.0–34.0)
MCHC: 30.5 g/dL (ref 30.0–36.0)
MCV: 82.2 fL (ref 80.0–100.0)
Monocytes Absolute: 0.5 10*3/uL (ref 0.1–1.0)
Monocytes Relative: 8 %
Neutro Abs: 3.6 10*3/uL (ref 1.7–7.7)
Neutrophils Relative %: 56 %
Platelets: 473 10*3/uL — ABNORMAL HIGH (ref 150–400)
RBC: 5.06 MIL/uL (ref 3.87–5.11)
RDW: 16.7 % — ABNORMAL HIGH (ref 11.5–15.5)
WBC: 6.3 10*3/uL (ref 4.0–10.5)
nRBC: 0 % (ref 0.0–0.2)

## 2021-07-08 LAB — TYPE AND SCREEN
ABO/RH(D): B POS
Antibody Screen: NEGATIVE

## 2021-07-08 MED ORDER — IOHEXOL 350 MG/ML SOLN
80.0000 mL | Freq: Once | INTRAVENOUS | Status: AC | PRN
  Administered 2021-07-08: 23:00:00 80 mL via INTRAVENOUS

## 2021-07-08 MED ORDER — LIDOCAINE VISCOUS HCL 2 % MT SOLN
15.0000 mL | Freq: Once | OROMUCOSAL | Status: AC
  Administered 2021-07-08: 22:00:00 15 mL via ORAL
  Filled 2021-07-08: qty 15

## 2021-07-08 MED ORDER — ALUM & MAG HYDROXIDE-SIMETH 200-200-20 MG/5ML PO SUSP
30.0000 mL | Freq: Once | ORAL | Status: AC
  Administered 2021-07-08: 22:00:00 30 mL via ORAL
  Filled 2021-07-08: qty 30

## 2021-07-08 MED ORDER — SUCRALFATE 1 GM/10ML PO SUSP: 1.0000 g | Freq: Three times a day (TID) | ORAL | 0 refills | Status: DC

## 2021-07-08 MED ORDER — HYDROXYZINE HCL 25 MG PO TABS: 25.0000 mg | ORAL_TABLET | Freq: Four times a day (QID) | ORAL | 0 refills | Status: AC | PRN

## 2021-07-08 MED ORDER — PANTOPRAZOLE SODIUM 40 MG IV SOLR
40.0000 mg | Freq: Once | INTRAVENOUS | Status: DC
  Filled 2021-07-08: qty 40

## 2021-07-08 MED ORDER — SUCRALFATE 1 G PO TABS
1.0000 g | ORAL_TABLET | Freq: Once | ORAL | Status: AC
  Administered 2021-07-08: 1 g via ORAL
  Filled 2021-07-08: qty 1

## 2021-07-08 MED ORDER — DICYCLOMINE HCL 20 MG PO TABS: 20.0000 mg | ORAL_TABLET | Freq: Two times a day (BID) | ORAL | 0 refills | Status: AC

## 2021-07-08 MED ORDER — ONDANSETRON HCL 4 MG/2ML IJ SOLN
4.0000 mg | Freq: Once | INTRAMUSCULAR | Status: AC
  Administered 2021-07-08: 22:00:00 4 mg via INTRAVENOUS
  Filled 2021-07-08: qty 2

## 2021-07-08 MED ORDER — LORAZEPAM 2 MG/ML IJ SOLN
1.0000 mg | Freq: Once | INTRAMUSCULAR | Status: AC
  Administered 2021-07-09: 1 mg via INTRAVENOUS
  Filled 2021-07-08: qty 1

## 2021-07-08 MED ORDER — SODIUM CHLORIDE 0.9 % IV BOLUS
1000.0000 mL | Freq: Once | INTRAVENOUS | Status: AC
  Administered 2021-07-08: 22:00:00 1000 mL via INTRAVENOUS

## 2021-07-08 MED ORDER — MORPHINE SULFATE (PF) 4 MG/ML IV SOLN
4.0000 mg | Freq: Once | INTRAVENOUS | Status: AC
  Administered 2021-07-08: 22:00:00 4 mg via INTRAVENOUS
  Filled 2021-07-08: qty 1

## 2021-07-08 NOTE — ED Provider Notes (Signed)
[facility]    CSN: [id number] Arrival date & time: 07/08/21  1015      History   Chief Complaint Chief Complaint  Patient presents with   Abdominal Pain    HPI Kelsey Sutton is a 35 y.o. female.   Patient here today for evaluation of lower abdominal pain, black tarry stools, and overall just not feeling well that is been ongoing for the last 2 days.  She was recently seen for abdominal issues and was diagnosed with ulcerative colitis as well as H. pylori.  She states she did not tolerate the treatment for H. pylori and discontinued same.  She does not report any fever.  The history is provided by the patient.  Abdominal Pain Associated symptoms: nausea, vaginal bleeding and vaginal discharge   Associated symptoms: no chills, no fever, no shortness of breath and no vomiting    Past Medical History:  Diagnosis Date   Anxiety    GERD (gastroesophageal reflux disease)    Hyperlipidemia    Hypertension     Patient Active Problem List   Diagnosis Date Noted   Nausea 06/02/2021   Rectal bleeding 06/02/2021   Abnormal CT scan, colon 06/02/2021   Abdominal pain, epigastric 08/05/2020   Gastroesophageal reflux disease 06/24/2020   Chronic constipation 06/24/2020   Lower abdominal pain 06/24/2020    Past Surgical History:  Procedure Laterality Date   CHALAZION EXCISION Right 2011   DILATION AND CURETTAGE OF UTERUS  2015    OB History   No obstetric history on file.      Home Medications    Prior to Admission medications   Medication Sig Start Date End Date Taking? Authorizing Provider  amphetamine-dextroamphetamine (ADDERALL XR) 10 MG 24 hr capsule Take 1 capsule (10 mg total) by mouth daily. 01/27/20   Moshe Cipro, NP  amphetamine-dextroamphetamine (ADDERALL XR) 30 MG 24 hr capsule Take 1 capsule (30 mg total) by mouth daily. 01/27/20   Moshe Cipro, NP  busPIRone (BUSPAR) 7.5 MG tablet Take 7.5 mg by mouth 2 (two) times daily. 05/04/21    [provider]  cloNIDine (CATAPRES) 0.1 MG tablet Take 0.1 mg by mouth at bedtime as needed.     [provider]  famotidine (PEPCID) 20 MG tablet Take 1 tablet (20 mg total) by mouth 2 (two) times daily. 06/24/20   Zehr, Princella Pellegrini, PA-C  hydrochlorothiazide (HYDRODIURIL) 25 MG tablet Take 25 mg by mouth daily.    [provider]  HYDROcodone-acetaminophen (NORCO/VICODIN) 5-325 MG tablet Take 1 tablet by mouth every 6 (six) hours as needed for moderate pain. 05/27/21   Fayrene Helper, PA-C  hydrOXYzine (VISTARIL) 25 MG capsule Take 25 mg by mouth as needed. For anxiety PRN 10/07/19   [provider]  omeprazole (PRILOSEC) 20 MG capsule Take 1 capsule (20 mg total) by mouth 2 (two) times daily before a meal for 14 days. 06/18/21 07/02/21  Cirigliano, [name], DO    Family History Family History  Problem Relation Age of Onset   Cancer Father    Prostate cancer Father    Inflammatory bowel disease Mother    Colon cancer Neg Hx    Pancreatic cancer Neg Hx    Esophageal cancer Neg Hx     Social History Social History   Tobacco Use   Smoking status: Never   Smokeless tobacco: Never  Vaping Use   Vaping Use: Former  Substance Use Topics   Alcohol use: Not Currently  Allergies   Penicillins and Protonix [pantoprazole]   Review of Systems Review of Systems  Constitutional:  Positive for unexpected weight change (patient reports weight loss). Negative for chills and fever.  Eyes:  Negative for discharge and redness.  Respiratory:  Negative for shortness of breath.   Gastrointestinal:  Positive for abdominal pain, blood in stool (black stools) and nausea. Negative for vomiting.  Genitourinary:  Positive for vaginal bleeding and vaginal discharge.    Physical Exam Triage Vital Signs ED Triage Vitals  Enc Vitals Group     BP 07/08/21 1054 134/87     Pulse Rate 07/08/21 1054 86     Resp 07/08/21 1054 18     Temp 07/08/21 1054 98.4 F (36.9  C)     Temp Source 07/08/21 1054 Oral     SpO2 07/08/21 1054 95 %     Weight --      Height --      Head Circumference --      Peak Flow --      Pain Score 07/08/21 1055 9     Pain Loc --      Pain Edu? --      Excl. in [place]? --    No data found.  Updated Vital Signs BP 134/87 (BP Location: Left Arm)   Pulse 86   Temp 98.4 F (36.9 C) (Oral)   Resp 18   LMP 06/23/2021   SpO2 95%   Physical Exam Vitals and nursing note reviewed.  Constitutional:      General: She is not in acute distress.    Appearance: Normal appearance. She is not ill-appearing.  HENT:     Head: Normocephalic and atraumatic.  Eyes:     Conjunctiva/sclera: Conjunctivae normal.  Cardiovascular:     Rate and Rhythm: Normal rate.  Pulmonary:     Effort: Pulmonary effort is normal.  Neurological:     Mental Status: She is alert.  Psychiatric:        Mood and Affect: Mood normal.        Behavior: Behavior normal.        Thought Content: Thought content normal.     UC Treatments / Results  Labs (all labs ordered are listed, but only abnormal results are displayed) Labs Reviewed - No data to display  EKG   Radiology No results found.  Procedures Procedures (including critical care time)  Medications Ordered in UC Medications - No data to display  Initial Impression / Assessment and Plan / UC Course  I have reviewed the triage vital signs and the nursing notes.  Pertinent labs & imaging results that were available during my care of the patient were reviewed by me and considered in my medical decision making (see chart for details).  Given past medical history with worsening abdominal pain and black stools recommended further evaluation in the emergency department as I suspect she will need stat imaging as well as lab work.  Patient expresses understanding and will report to ED after leaving this office. She does feel stable to drive herself.   Final Clinical Impressions(s) / UC Diagnoses    Final diagnoses:  Black stools  Abdominal pain, unspecified abdominal location   Discharge Instructions   None    ED Prescriptions   None    PDMP not reviewed this encounter.   Francene Finders, PA-C 07/08/21 1302

## 2021-07-08 NOTE — ED Triage Notes (Signed)
Pt c/o black feces, "like my guts are falling out," abdominal burning, lethargic, nausea, vomiting.  Dx with H pylori a few weeks ago but did not tolerate abx. States provider told her she does not need to complete tx since no ulcer present.   Also dx with UC a few months ago.   Onset about 2 days ago

## 2021-07-08 NOTE — ED Triage Notes (Signed)
Pt presents with c/o black tarry stools and abdominal pain. 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 Pt reports she feels like she can taste blood. Pt was recently seen at [facility] and told to come here for a possible CT scan.

## 2021-07-08 NOTE — Discharge Instructions (Signed)
Your abdominal discomfort is likely due to peptic ulcer disease from H. pylori infection.  It is important for you to follow-up closely with your gastroenterologist for further managements of your condition.  You may take Carafate and Bentyl prescribed for comfort.  Return if you have any concern.

## 2021-07-08 NOTE — ED Provider Notes (Signed)
Emergency Medicine Provider Triage Evaluation Note  Kelsey Sutton , a 35 y.o. female  was evaluated in triage.  Pt complains of black stools x 3 days. She has burning sensation to her abdomen, burning to chest, and nausea. She hasn't tried any medications. Denies vomiting, shortness of breath. She was diagnosed with H. Pylori and wasn't able to finish all her antibiotics.    Review of Systems  Positive: Black stools, burning to abdomen and chest Negative: Shortness of breath  Physical Exam  BP (!) 152/116 (BP Location: Right Arm)   Pulse 97   Temp 98.3 F (36.8 C) (Oral)   Resp 20   LMP 06/23/2021   SpO2 100%  Gen:   Awake, no distress   Resp:  Normal effort MSK:   Moves extremities without difficulty Other:  Generalized abdominal tenderness to palpation.  Medical Decision Making  Medically screening exam initiated at 1:03 PM.  Appropriate orders placed.  Kelsey Sutton was informed that the remainder of the evaluation will be completed by another provider, this initial triage assessment does not replace that evaluation, and the importance of remaining in the ED until their evaluation is complete.   Tavaras Goody A, PA-C 07/08/21 1315    Ernie Avena, MD 07/08/21 1329

## 2021-07-08 NOTE — ED Provider Notes (Signed)
[facility] DEPT Provider Note   CSN: [id number] Arrival date & time: 07/08/21  1245     History Chief Complaint  Patient presents with   Abdominal Pain    Kelsey Sutton is a 35 y.o. female.  The history is provided by the patient and medical records. No language interpreter was used.  Abdominal Pain  35 year old female seen in history of GERD, anxiety, hypertension, hyperlipidemia who presents for evaluation of black tarry stool and abdominal pain.  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  She was prescribed 2 different an antibiotic and it did clear up her colitis.  She had a recent colonoscopy and endoscopy eval about gastroenterology and was told that she has H. pylori.  She was prescribed several different medication but reports she was unable to tolerate these medications.  For the past few days she endorsed having epigastric abdominal pain described as a burning sensation radiates towards her throat, diffuse abdominal discomfort, as well as noticing black and tarry stool.  History of do not feel well, her discomfort is moderate in severity with decrease in appetite.  No fever chills chest pain shortness of breath productive cough or dysuria.  She denies regular NSAID use, alcohol use, or being on any counter blood thinning medication.  She was seen at urgent care today but was sent here for further assessment.    Past Medical History:  Diagnosis Date   Anxiety    GERD (gastroesophageal reflux disease)    Hyperlipidemia    Hypertension     Patient Active Problem List   Diagnosis Date Noted   Nausea 06/02/2021   Rectal bleeding 06/02/2021   Abnormal CT scan, colon 06/02/2021   Abdominal pain, epigastric 08/05/2020   Gastroesophageal reflux disease 06/24/2020   Chronic constipation 06/24/2020   Lower  abdominal pain 06/24/2020    Past Surgical History:  Procedure Laterality Date   CHALAZION EXCISION Right 2011   [place] OF UTERUS  2015     OB History   No obstetric history on file.     Family History  Problem Relation Age of Onset   Cancer Father    Prostate cancer Father    Inflammatory bowel disease Mother    Colon cancer Neg Hx    Pancreatic cancer Neg Hx    Esophageal cancer Neg Hx     Social History   Tobacco Use   Smoking status: Never   Smokeless tobacco: Never  Vaping Use   Vaping Use: Former  Substance Use Topics   Alcohol use: Not Currently    Home Medications Prior to Admission medications   Medication Sig Start Date End Date Taking? Authorizing Provider  amphetamine-dextroamphetamine (ADDERALL XR) 10 MG 24 hr capsule Take 1 capsule (10 mg total) by mouth daily. 01/27/20   Faustino Congress, NP  amphetamine-dextroamphetamine (ADDERALL XR) 30 MG 24 hr capsule Take 1 capsule (30 mg total) by mouth daily. 01/27/20   Faustino Congress, NP  busPIRone (BUSPAR) 7.5 MG tablet Take 7.5 mg by mouth 2 (two) times daily. 05/04/21   [provider]  cloNIDine (CATAPRES) 0.1 MG tablet Take 0.1 mg by mouth at bedtime as needed.     [provider]  famotidine (PEPCID) 20 MG tablet Take 1 tablet (20 mg total) by mouth 2 (two) times daily. 06/24/20  Zehr, Laban Emperor, PA-C  hydrochlorothiazide (HYDRODIURIL) 25 MG tablet Take 25 mg by mouth daily.    [provider]  HYDROcodone-acetaminophen (NORCO/VICODIN) 5-325 MG tablet Take 1 tablet by mouth every 6 (six) hours as needed for moderate pain. 05/27/21   Domenic Moras, PA-C  hydrOXYzine (VISTARIL) 25 MG capsule Take 25 mg by mouth as needed. For anxiety PRN 10/07/19   [provider]  omeprazole (PRILOSEC) 20 MG capsule Take 1 capsule (20 mg total) by mouth 2 (two) times daily before a meal for 14 days. 06/18/21 07/02/21  Cirigliano, Vito V, DO    Allergies    Penicillins and  Protonix [pantoprazole]  Review of Systems   Review of Systems  Gastrointestinal:  Positive for abdominal pain.  All other systems reviewed and are negative.  Physical Exam Updated Vital Signs BP (!) 141/100 (BP Location: Left Arm)   Pulse 96   Temp 98.3 F (36.8 C) (Oral)   Resp 17   LMP 06/23/2021   SpO2 100%   Physical Exam Vitals and nursing note reviewed.  Constitutional:      General: She is not in acute distress.    Appearance: She is well-developed. She is obese.  HENT:     Head: Atraumatic.  Eyes:     Conjunctiva/sclera: Conjunctivae normal.  Cardiovascular:     Rate and Rhythm: Normal rate and regular rhythm.  Pulmonary:     Effort: Pulmonary effort is normal.  Abdominal:     General: Abdomen is flat. Bowel sounds are normal.     Palpations: Abdomen is soft.     Tenderness: There is generalized abdominal tenderness.  Genitourinary:    Comments: Chaperone present during exam.  Normal rectal tone, no obvious mass, black tarry stool noted on glove. Musculoskeletal:     Cervical back: Neck supple.  Skin:    Findings: No rash.  Neurological:     Mental Status: She is alert.  Psychiatric:        Mood and Affect: Mood normal.    ED Results / Procedures / Treatments   Labs (all labs ordered are listed, but only abnormal results are displayed) Labs Reviewed  CBC WITH DIFFERENTIAL/PLATELET - Abnormal; Notable for the following components:      Result Value   MCH 25.1 (*)    RDW 16.7 (*)    Platelets 473 (*)    All other components within normal limits  COMPREHENSIVE METABOLIC PANEL - Abnormal; Notable for the following components:   BUN <5 (*)    All other components within normal limits  URINALYSIS, ROUTINE W REFLEX MICROSCOPIC - Abnormal; Notable for the following components:   APPearance HAZY (*)    Ketones, ur 5 (*)    All other components within normal limits  LIPASE, BLOOD  POC OCCULT BLOOD, ED  TYPE AND SCREEN  ABO/RH     EKG None  Radiology CT ABDOMEN PELVIS W CONTRAST  Result Date: 07/08/2021 CLINICAL DATA:  Acute abdominal pain with black stools, initial encounter EXAM: CT ABDOMEN AND PELVIS WITH CONTRAST TECHNIQUE: Multidetector CT imaging of the abdomen and pelvis was performed using the standard protocol following bolus administration of intravenous contrast. CONTRAST:  68mL OMNIPAQUE IOHEXOL 350 MG/ML SOLN COMPARISON:  05/27/2021 FINDINGS: Lower chest: Visualized lung bases are free of acute infiltrate or effusion. Hepatobiliary: No focal liver abnormality is seen. No gallstones, gallbladder wall thickening, or biliary dilatation. Pancreas: Unremarkable. No pancreatic ductal dilatation or surrounding inflammatory changes. Spleen: Normal in size without focal abnormality.  Adrenals/Urinary Tract: Adrenal glands again demonstrate a hypodense lesion within the left adrenal stable from the prior exam. It again measures approximately 2.6 cm in greatest dimension. Right adrenal gland is within normal limits. Kidneys demonstrate a normal enhancement pattern bilaterally. No calculi or obstructive changes are seen. The bladder is decompressed. Stomach/Bowel: Colon shows mild diverticular change without evidence of diverticulitis. The appendix is within normal limits. Small bowel and stomach are within normal limits. Vascular/Lymphatic: No significant vascular findings are present. No enlarged abdominal or pelvic lymph nodes. Reproductive: Uterus and bilateral adnexa are unremarkable. Other: No abdominal wall hernia or abnormality. No abdominopelvic ascites. Musculoskeletal: No acute or significant osseous findings. 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 Previously seen changes of colitis have resolved in the interval. No acute abnormality is identified. Electronically Signed   By: Alcide Clever M.D.   On: 07/08/2021 22:52    Procedures Procedures   Medications Ordered in ED Medications   sucralfate (CARAFATE) tablet 1 g (has no administration in time range)  ondansetron (ZOFRAN) injection 4 mg (4 mg Intravenous Given 07/08/21 2157)  sodium chloride 0.9 % bolus 1,000 mL (1,000 mLs Intravenous New Bag/Given 07/08/21 2157)  alum & mag hydroxide-simeth (MAALOX/MYLANTA) 200-200-20 MG/5ML suspension 30 mL (30 mLs Oral Given 07/08/21 2157)    And  lidocaine (XYLOCAINE) 2 % viscous mouth solution 15 mL (15 mLs Oral Given 07/08/21 2157)  morphine 4 MG/ML injection 4 mg (4 mg Intravenous Given 07/08/21 2157)  iohexol (OMNIPAQUE) 350 MG/ML injection 80 mL (80 mLs Intravenous Contrast Given 07/08/21 2239)    ED Course  I have reviewed the triage vital signs and the nursing notes.  Pertinent labs & imaging results that were available during my care of the patient were reviewed by me and considered in my medical decision making (see chart for details).    MDM Rules/Calculators/A&P                           BP (!) 176/105   Pulse 98   Temp 98.3 F (36.8 C) (Oral)   Resp 18   LMP 06/23/2021   SpO2 100%   Final Clinical Impression(s) / ED Diagnoses Final diagnoses:  UGIB (upper gastrointestinal bleed)    Rx / DC Orders ED Discharge Orders          Ordered    dicyclomine (BENTYL) 20 MG tablet  2 times daily        07/08/21 2318    sucralfate (CARAFATE) 1 GM/10ML suspension  3 times daily with meals & bedtime        07/08/21 2318           9:21 PM Patient endorsed having black tarry stool, burning sensation in abdomen, and overall not feeling well for the past 3 days.  She does have melanotic stool on physical exam.  Previously diagnosed with H. pylori but did not tolerate medication prescribed.  Still endorse abdominal discomfort.  Labs overall reassuring.  Patient requesting for advanced imaging.  CT scan ordered.  9:41 PM Pt report she is allergic to protonix.  Will cancel order.   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  Previously seen change of colitis has resolved in the interval.  No other acute abnormality is identified.  I discussed this finding with patient.  Her abdominal discomfort is likely due to  peptic ulcer disease secondary to H. pylori that is untreated because patient cannot tolerate medication prescribed.  She also refused Protonix.  At this time, I felt patient would best managed outpatient by [facility].  Plan to prescribe Carafate and Bentyl to help with the symptom.  Return precaution given.   Domenic Moras, PA-C 07/08/21 2325    Davonna Belling, MD 07/09/21 [phone]

## 2021-07-09 ENCOUNTER — Telehealth: Payer: Self-pay | Admitting: Gastroenterology

## 2021-07-09 DIAGNOSIS — K922 Gastrointestinal hemorrhage, unspecified: Secondary | ICD-10-CM | POA: Diagnosis not present

## 2021-07-09 LAB — POC OCCULT BLOOD, ED: Fecal Occult Bld: NEGATIVE

## 2021-07-09 NOTE — ED Notes (Signed)
Patient verbalizes understanding of discharge instructions. Opportunity for questioning and answers were provided. Armband removed by staff, pt discharged from ED. Ambulated out to lobby, mother provided transport home

## 2021-07-09 NOTE — Telephone Encounter (Signed)
Inbound call from patient. States patient was currently in ED for 2 days start. States patient is having black stool, blood in stool, infection, and does not know what to do. Patient would like an emergency appt. Best contact number [phone]

## 2021-07-09 NOTE — Telephone Encounter (Signed)
Returned call to patient. Pt states that she has black stools, rectal bleeding and burning epigastric pain. Pt does report that Pepto-bismol is the only thing that she can keep down. Advised that the Pepto-bismol can be causing her stools to be black. Advised patient to try a bland diet since she is having burning pain in her upper abdomen. She reports that she was diagnosed with peptic ulcer disease due to untreated H. Pylori. She is not able to tolerate the treatment. Pt has been scheduled for next available appt. Pt is scheduled to see Hyacinth Meeker, PA-C on Tuesday, 07/19/21 at 1:30 pm. We have reviewed symptoms that will require ED evaluation. Pt verbalized understanding and had no concerns at the end of the call.

## 2021-07-12 ENCOUNTER — Other Ambulatory Visit: Payer: Self-pay | Admitting: Gastroenterology

## 2021-07-14 ENCOUNTER — Telehealth: Payer: Self-pay | Admitting: General Practice

## 2021-07-14 ENCOUNTER — Encounter: Payer: BC Managed Care – PPO | Admitting: Obstetrics & Gynecology

## 2021-07-14 NOTE — Telephone Encounter (Signed)
Patient called into front office and reached after hours nurse. Patient stated she wasn't feeling well so she wouldn't be coming to her appt. Patient was requesting a call back from a nurse.   Called patient, no answer- unable to leave message as voicemail was full.

## 2021-07-17 ENCOUNTER — Ambulatory Visit
Admission: EM | Admit: 2021-07-17 | Discharge: 2021-07-17 | Disposition: A | Discharge status: Home / Self Care | Payer: BC Managed Care – PPO

## 2021-07-17 ENCOUNTER — Encounter: Payer: Self-pay | Admitting: *Deleted

## 2021-07-17 ENCOUNTER — Other Ambulatory Visit: Payer: Self-pay

## 2021-07-17 DIAGNOSIS — Z20822 Contact with and (suspected) exposure to covid-19: Secondary | ICD-10-CM | POA: Diagnosis not present

## 2021-07-17 DIAGNOSIS — R051 Acute cough: Secondary | ICD-10-CM | POA: Diagnosis not present

## 2021-07-17 HISTORY — DX: Noninfective gastroenteritis and colitis, unspecified: K52.9

## 2021-07-17 HISTORY — DX: Other specified bacterial intestinal infections: A04.8

## 2021-07-17 MED ORDER — IBUPROFEN 800 MG PO TABS: 800.0000 mg | ORAL_TABLET | Freq: Once | ORAL | Status: DC

## 2021-07-17 MED ORDER — ALBUTEROL SULFATE HFA 108 (90 BASE) MCG/ACT IN AERS: 1.0000 | INHALATION_SPRAY | Freq: Four times a day (QID) | RESPIRATORY_TRACT | 0 refills | Status: DC | PRN

## 2021-07-17 MED ORDER — IBUPROFEN 400 MG PO TABS
400.0000 mg | ORAL_TABLET | Freq: Once | ORAL | Status: AC
  Administered 2021-07-17: 400 mg via ORAL

## 2021-07-17 NOTE — ED Provider Notes (Signed)
[facility]    CSN: [id number] Arrival date & time: 07/17/21  1339      History   Chief Complaint Chief Complaint  Patient presents with   Cough   Nasal Congestion    HPI Kelsey Sutton is a 35 y.o. female.   Patient here today for evaluation of nasal congestion and drainage, chest pain with cough, diarrhea, and fever that started yesterday.  She does not report any treatment for symptoms but is requesting steroid injection for chest tightness.  She does not report any vomiting.  She states she has had COVID in the past and does not feel that her current symptoms are similar.  She reports multiple family members have had recent upper respiratory infections.  The history is provided by the patient.  Cough Associated symptoms: fever and rhinorrhea   Associated symptoms: no eye discharge    Past Medical History:  Diagnosis Date   Anxiety    Colitis    GERD (gastroesophageal reflux disease)    Helicobacter pylori infection    Hyperlipidemia    Hypertension     Patient Active Problem List   Diagnosis Date Noted   Nausea 06/02/2021   Rectal bleeding 06/02/2021   Abnormal CT scan, colon 06/02/2021   Abdominal pain, epigastric 08/05/2020   Gastroesophageal reflux disease 06/24/2020   Chronic constipation 06/24/2020   Lower abdominal pain 06/24/2020    Past Surgical History:  Procedure Laterality Date   CHALAZION EXCISION Right 2011   DILATION AND CURETTAGE OF UTERUS  2015    OB History   No obstetric history on file.      Home Medications    Prior to Admission medications   Medication Sig Start Date End Date Taking? Authorizing Provider  albuterol (VENTOLIN HFA) 108 (90 Base) MCG/ACT inhaler Inhale 1-2 puffs into the lungs every 6 (six) hours as needed for wheezing or shortness of breath. 07/17/21  Yes Tomi Bamberger, PA-C  amphetamine-dextroamphetamine (ADDERALL XR) 10 MG 24 hr capsule Take 1 capsule (10 mg total) by mouth daily. 01/27/20  Yes  Moshe Cipro, NP  amphetamine-dextroamphetamine (ADDERALL XR) 30 MG 24 hr capsule Take 1 capsule (30 mg total) by mouth daily. 01/27/20  Yes Moshe Cipro, NP  CIPROFLOXACIN HCL PO Take by mouth. For colitis   Yes [provider]  cloNIDine (CATAPRES) 0.1 MG tablet Take 0.1 mg by mouth at bedtime as needed.    Yes [provider]  dicyclomine (BENTYL) 20 MG tablet Take 1 tablet (20 mg total) by mouth 2 (two) times daily. 07/08/21  Yes Fayrene Helper, PA-C  famotidine (PEPCID) 20 MG tablet TAKE 1 TABLET(20 MG) BY MOUTH TWICE DAILY 07/14/21  Yes Zehr, Shanda Bumps D, PA-C  hydrOXYzine (ATARAX/VISTARIL) 25 MG tablet Take 1 tablet (25 mg total) by mouth every 6 (six) hours as needed for anxiety. 07/08/21  Yes Fayrene Helper, PA-C  sucralfate (CARAFATE) 1 GM/10ML suspension Take 10 mLs (1 g total) by mouth 4 (four) times daily -  with meals and at bedtime. 07/08/21  Yes Fayrene Helper, PA-C  busPIRone (BUSPAR) 7.5 MG tablet Take 7.5 mg by mouth 2 (two) times daily. 05/04/21   [provider]  hydrochlorothiazide (HYDRODIURIL) 25 MG tablet Take 25 mg by mouth daily.    [provider]  HYDROcodone-acetaminophen (NORCO/VICODIN) 5-325 MG tablet Take 1 tablet by mouth every 6 (six) hours as needed for moderate pain. 05/27/21   Fayrene Helper, PA-C  omeprazole (PRILOSEC) 20 MG capsule Take 1 capsule (20 mg  total) by mouth 2 (two) times daily before a meal for 14 days. 06/18/21 07/02/21  Cirigliano, Dominic Pea, DO    Family History Family History  Problem Relation Age of Onset   Inflammatory bowel disease Mother    Cancer Father    Prostate cancer Father    Colon cancer Neg Hx    Pancreatic cancer Neg Hx    Esophageal cancer Neg Hx     Social History Social History   Tobacco Use   Smoking status: Never   Smokeless tobacco: Never  Vaping Use   Vaping Use: Former  Substance Use Topics   Alcohol use: Not Currently   Drug use: Never     Allergies   Penicillins and  Protonix [pantoprazole]   Review of Systems Review of Systems  Constitutional:  Positive for fever.  HENT:  Positive for congestion and rhinorrhea.   Eyes:  Negative for discharge and redness.  Respiratory:  Positive for cough and chest tightness.   Gastrointestinal:  Positive for diarrhea. Negative for nausea and vomiting.    Physical Exam Triage Vital Signs ED Triage Vitals  Enc Vitals Group     BP 07/17/21 1450 121/84     Pulse Rate 07/17/21 1450 (!) 103     Resp 07/17/21 1450 18     Temp 07/17/21 1450 99.9 F (37.7 C)     Temp Source 07/17/21 1450 Temporal     SpO2 07/17/21 1450 99 %     Weight --      Height --      Head Circumference --      Peak Flow --      Pain Score 07/17/21 1451 7     Pain Loc --      Pain Edu? --      Excl. in [place]? --    No data found.  Updated Vital Signs BP 121/84   Pulse (!) 103   Temp 99.9 F (37.7 C) (Temporal)   Resp 18   LMP 06/30/2021 (Approximate)   SpO2 99%      Physical Exam Vitals and nursing note reviewed.  Constitutional:      General: She is not in acute distress.    Appearance: Normal appearance. She is not ill-appearing.  HENT:     Head: Normocephalic and atraumatic.     Nose: Congestion present.     Mouth/Throat:     Mouth: Mucous membranes are moist.     Pharynx: No oropharyngeal exudate or posterior oropharyngeal erythema.  Eyes:     Conjunctiva/sclera: Conjunctivae normal.  Cardiovascular:     Rate and Rhythm: Normal rate and regular rhythm.     Heart sounds: Normal heart sounds. No murmur heard. Pulmonary:     Effort: Pulmonary effort is normal. No respiratory distress.     Breath sounds: Normal breath sounds. No wheezing, rhonchi or rales.  Skin:    General: Skin is warm and dry.  Neurological:     Mental Status: She is alert.  Psychiatric:        Mood and Affect: Mood normal.        Thought Content: Thought content normal.     UC Treatments / Results  Labs (all labs ordered are listed, but  only abnormal results are displayed) Labs Reviewed  COVID-19, FLU A+B NAA    EKG   Radiology No results found.  Procedures Procedures (including critical care time)  Medications Ordered in UC Medications - No data to display  Initial Impression /  Assessment and Plan / UC Course  I have reviewed the triage vital signs and the nursing notes.  Pertinent labs & imaging results that were available during my care of the patient were reviewed by me and considered in my medical decision making (see chart for details).   Albuterol inhaler prescribed for reported chest tightness. Discussed that I recommended avoidance of systemic steroids at this time given unknown cause of illness and recommended she await results of covid and flu screening for further recommendation. Recommend OTC treatment in the meantime with increased fluids and rest. Encourage follow up with any further concerns.   Final Clinical Impressions(s) / UC Diagnoses   Final diagnoses:  Acute cough  Encounter for laboratory testing for COVID-19 virus   Discharge Instructions   None    ED Prescriptions     Medication Sig Dispense Auth. Provider   albuterol (VENTOLIN HFA) 108 (90 Base) MCG/ACT inhaler Inhale 1-2 puffs into the lungs every 6 (six) hours as needed for wheezing or shortness of breath. 8 g Tomi Bamberger, PA-C      PDMP not reviewed this encounter.   Tomi Bamberger, PA-C 07/17/21 [phone]

## 2021-07-17 NOTE — ED Triage Notes (Signed)
C/O cough, nasal congestion, chest pain with coughing, body aches, slight diarrhea onset last night.  Reports tactile fever.

## 2021-07-18 LAB — COVID-19, FLU A+B NAA
Influenza A, NAA: NOT DETECTED
Influenza B, NAA: NOT DETECTED
SARS-CoV-2, NAA: DETECTED — AB

## 2021-07-19 ENCOUNTER — Ambulatory Visit: Payer: BC Managed Care – PPO | Admitting: Physician Assistant

## 2021-11-02 ENCOUNTER — Encounter (HOSPITAL_BASED_OUTPATIENT_CLINIC_OR_DEPARTMENT_OTHER): Payer: Self-pay

## 2021-11-02 ENCOUNTER — Other Ambulatory Visit: Payer: Self-pay

## 2021-11-02 ENCOUNTER — Emergency Department (HOSPITAL_BASED_OUTPATIENT_CLINIC_OR_DEPARTMENT_OTHER)
Admission: EM | Admit: 2021-11-02 | Discharge: 2021-11-02 | Disposition: A | Discharge status: Home / Self Care | Payer: BC Managed Care – PPO | Attending: Emergency Medicine | Admitting: Emergency Medicine

## 2021-11-02 DIAGNOSIS — Z79899 Other long term (current) drug therapy: Secondary | ICD-10-CM | POA: Insufficient documentation

## 2021-11-02 DIAGNOSIS — I10 Essential (primary) hypertension: Secondary | ICD-10-CM | POA: Diagnosis not present

## 2021-11-02 DIAGNOSIS — R1012 Left upper quadrant pain: Secondary | ICD-10-CM | POA: Diagnosis present

## 2021-11-02 DIAGNOSIS — D75839 Thrombocytosis, unspecified: Secondary | ICD-10-CM | POA: Diagnosis not present

## 2021-11-02 DIAGNOSIS — K279 Peptic ulcer, site unspecified, unspecified as acute or chronic, without hemorrhage or perforation: Secondary | ICD-10-CM | POA: Insufficient documentation

## 2021-11-02 LAB — URINALYSIS, ROUTINE W REFLEX MICROSCOPIC
Bilirubin Urine: NEGATIVE
Glucose, UA: NEGATIVE mg/dL
Ketones, ur: NEGATIVE mg/dL
Nitrite: NEGATIVE
Specific Gravity, Urine: 1.011 (ref 1.005–1.030)
pH: 6 (ref 5.0–8.0)

## 2021-11-02 LAB — CBC
HCT: 40.1 % (ref 36.0–46.0)
Hemoglobin: 12.4 g/dL (ref 12.0–15.0)
MCH: 24.8 pg — ABNORMAL LOW (ref 26.0–34.0)
MCHC: 30.9 g/dL (ref 30.0–36.0)
MCV: 80 fL (ref 80.0–100.0)
Platelets: 418 10*3/uL — ABNORMAL HIGH (ref 150–400)
RBC: 5.01 MIL/uL (ref 3.87–5.11)
RDW: 16.7 % — ABNORMAL HIGH (ref 11.5–15.5)
WBC: 7.4 10*3/uL (ref 4.0–10.5)
nRBC: 0 % (ref 0.0–0.2)

## 2021-11-02 LAB — COMPREHENSIVE METABOLIC PANEL
ALT: 14 U/L (ref 0–44)
AST: 15 U/L (ref 15–41)
Albumin: 3.8 g/dL (ref 3.5–5.0)
Alkaline Phosphatase: 93 U/L (ref 38–126)
Anion gap: 7 (ref 5–15)
BUN: 9 mg/dL (ref 6–20)
CO2: 26 mmol/L (ref 22–32)
Calcium: 9.3 mg/dL (ref 8.9–10.3)
Chloride: 105 mmol/L (ref 98–111)
Creatinine, Ser: 0.74 mg/dL (ref 0.44–1.00)
GFR, Estimated: >60 mL/min (ref >60)
Glucose, Bld: 98 mg/dL (ref 70–99)
Potassium: 4.1 mmol/L (ref 3.5–5.1)
Sodium: 138 mmol/L (ref 135–145)
Total Bilirubin: 0.6 mg/dL (ref 0.3–1.2)
Total Protein: 7.4 g/dL (ref 6.5–8.1)

## 2021-11-02 LAB — PREGNANCY, URINE: Preg Test, Ur: NEGATIVE

## 2021-11-02 LAB — LIPASE, BLOOD: Lipase: 25 U/L (ref 11–51)

## 2021-11-02 MED ORDER — FAMOTIDINE 20 MG PO TABS
40.0000 mg | ORAL_TABLET | Freq: Once | ORAL | Status: AC
  Administered 2021-11-02: 40 mg via ORAL
  Filled 2021-11-02: qty 2

## 2021-11-02 MED ORDER — SUCRALFATE 1 G PO TABS
1.0000 g | ORAL_TABLET | Freq: Three times a day (TID) | ORAL | 0 refills | Status: AC
Start: 2021-11-02 — End: ?

## 2021-11-02 MED ORDER — SUCRALFATE 1 GM/10ML PO SUSP
1.0000 g | Freq: Once | ORAL | Status: AC
Start: 2021-11-02 — End: 2021-11-02
  Administered 2021-11-02: 1 g via ORAL
  Filled 2021-11-02: qty 10

## 2021-11-02 NOTE — ED Triage Notes (Signed)
Patient here POV from Home with ABD Pain. ? ?Endorses LUQ ABD Pain since 4-5 days. Intermittent in [place]. Non-Radiating.  ? ?Diagnosed with H. Pylori 5-6 months PTA.  ? ?Endorses associated Constipation. Mild Nausea. No Emesis. No Discernable Urinary Symptoms besides more Output.  ? ?NAD during Triage. A&Ox4. GCS 15. Ambulatory.  ?

## 2021-11-02 NOTE — Discharge Instructions (Addendum)
Take antacids as needed. ? ?Your case is difficult because you have not been ale to tolerate the treatments for H. Pylori. Please follow up with the gastroenterologist to discuss treatment options. ?

## 2021-11-02 NOTE — ED Provider Notes (Signed)
?[facility] ?Provider Note ? ? ?CSN: [id number] ?Arrival date & time: 11/02/21  1926 ? ?  ? ?History ? ?Chief Complaint  ?Patient presents with  ? Abdominal Pain  ? ? ?Kelsey Sutton is a 36 y.o. female. ? ?The history is provided by the patient.  ?Abdominal Pain ?She has history of hypertension, hyperlipidemia, H. pylori infection, GERD and comes in with worsening left upper quadrant pain.  He had been diagnosed with H. pylori last fall and was put on a treatment regimen which she was not able to tolerate.  She is currently taking famotidine 40 mg twice a day and dicyclomine 20 mg twice a day.  Dicyclomine does seem to give her some temporary relief.  Over the last 2 days, she has had increased pain in her left upper quadrant.  Pain is burning with some increased flatulence.  She states no significant relief of discomfort with passing flatus.  There has been some mild nausea but no vomiting.  She has been compliant with her medications.  She denies tobacco or ethanol use. ?  ?Home Medications ?Prior to Admission medications   ?Medication Sig Start Date End Date Taking? Authorizing Provider  ?albuterol (VENTOLIN HFA) 108 (90 Base) MCG/ACT inhaler Inhale 1-2 puffs into the lungs every 6 (six) hours as needed for wheezing or shortness of breath. 07/17/21   Tomi Bamberger, PA-C  ?amphetamine-dextroamphetamine (ADDERALL XR) 10 MG 24 hr capsule Take 1 capsule (10 mg total) by mouth daily. 01/27/20   Moshe Cipro, NP  ?amphetamine-dextroamphetamine (ADDERALL XR) 30 MG 24 hr capsule Take 1 capsule (30 mg total) by mouth daily. 01/27/20   Moshe Cipro, NP  ?busPIRone (BUSPAR) 7.5 MG tablet Take 7.5 mg by mouth 2 (two) times daily. 05/04/21   [provider]  ?CIPROFLOXACIN HCL PO Take by mouth. For colitis    [provider]  ?cloNIDine (CATAPRES) 0.1 MG tablet Take 0.1 mg by mouth at bedtime as needed.     [provider]  ?dicyclomine (BENTYL) 20 MG  tablet Take 1 tablet (20 mg total) by mouth 2 (two) times daily. 07/08/21   Fayrene Helper, PA-C  ?famotidine (PEPCID) 20 MG tablet TAKE 1 TABLET(20 MG) BY MOUTH TWICE DAILY 07/14/21   Zehr, Shanda Bumps D, PA-C  ?hydrochlorothiazide (HYDRODIURIL) 25 MG tablet Take 25 mg by mouth daily.    [provider]  ?HYDROcodone-acetaminophen (NORCO/VICODIN) 5-325 MG tablet Take 1 tablet by mouth every 6 (six) hours as needed for moderate pain. 05/27/21   Fayrene Helper, PA-C  ?hydrOXYzine (ATARAX/VISTARIL) 25 MG tablet Take 1 tablet (25 mg total) by mouth every 6 (six) hours as needed for anxiety. 07/08/21   Fayrene Helper, PA-C  ?omeprazole (PRILOSEC) 20 MG capsule Take 1 capsule (20 mg total) by mouth 2 (two) times daily before a meal for 14 days. 06/18/21 07/02/21  Cirigliano, Vito V, DO  ?sucralfate (CARAFATE) 1 GM/10ML suspension Take 10 mLs (1 g total) by mouth 4 (four) times daily -  with meals and at bedtime. 07/08/21   Fayrene Helper, PA-C  ?   ? ?Allergies    ?Penicillins and Protonix [pantoprazole]   ? ?Review of Systems   ?Review of Systems  ?Gastrointestinal:  Positive for abdominal pain.  ?All other systems reviewed and are negative. ? ?Physical Exam ?Updated Vital Signs ?BP 124/66 (BP Location: Right Arm)   Pulse 78   Temp 98.3 ?F (36.8 ?C)   Resp 20   Ht 5\' 1"  (1.549 m)   Wt  108.4 kg   SpO2 100%   BMI 45.16 kg/m?  ?Physical Exam ?Vitals and nursing note reviewed.  ?36 year old female, resting comfortably and in no acute distress. Vital signs are normal. Oxygen saturation is 100%, which is normal. ?Head is normocephalic and atraumatic. PERRLA, EOMI. Oropharynx is clear. ?Neck is nontender and supple without adenopathy or JVD. ?Back is nontender and there is no CVA tenderness. ?Lungs are clear without rales, wheezes, or rhonchi. ?Chest is nontender. ?Heart has regular rate and rhythm without murmur. ?Abdomen is soft, flat, with mild left upper quadrant tenderness.  There is no rebound or guarding.  There are no  masses or hepatosplenomegaly and peristalsis is normoactive. ?Extremities have no cyanosis or edema, full range of motion is present. ?Skin is warm and dry without rash. ?Neurologic: Mental status is normal, cranial nerves are intact, moves all extremities equally. ? ?ED Results / Procedures / Treatments   ?Labs ?(all labs ordered are listed, but only abnormal results are displayed) ?Labs Reviewed  ?CBC - Abnormal; Notable for the following components:  ?    Result Value  ? MCH 24.8 (*)   ? RDW 16.7 (*)   ? Platelets 418 (*)   ? All other components within normal limits  ?URINALYSIS, ROUTINE W REFLEX MICROSCOPIC - Abnormal; Notable for the following components:  ? Color, Urine COLORLESS (*)   ? APPearance HAZY (*)   ? Hgb urine dipstick LARGE (*)   ? Protein, ur TRACE (*)   ? Leukocytes,Ua TRACE (*)   ? Bacteria, UA RARE (*)   ? All other components within normal limits  ?LIPASE, BLOOD  ?COMPREHENSIVE METABOLIC PANEL  ?PREGNANCY, URINE  ? ?Procedures ?Procedures  ? ? ?Medications Ordered in ED ?Medications  ?sucralfate (CARAFATE) 1 GM/10ML suspension 1 g (1 g Oral Given 11/02/21 2329)  ?famotidine (PEPCID) tablet 40 mg (40 mg Oral Given 11/02/21 2329)  ? ? ?ED Course/ Medical Decision Making/ A&P ?  ?                        ?Medical Decision Making ?Amount and/or Complexity of Data Reviewed ?Labs: ordered. ? ? ?Left upper quadrant pain in patient with known history of H. pylori infection and has been unable to tolerate treatment for it.  Specifically, she relates intolerance to clarithromycin and metronidazole and doxycycline and proton pump inhibitors.  Unfortunately, all treatment regimens involve some of those medications.  She is already on high-dose H2 blockers.  She may need higher dose of an H2 blocker or may need to get adjuvant treatment to where she can tolerate the regimen for H. pylori treatment.  This will need to be evaluated by a gastroenterologist.  Labs today are reassuring with stable hemoglobin,  normal WBC.  Mild elevation of platelet count is chronic and unchanged.  In the meantime, we will add sucralfate to her regimen, recommended that she use over-the-counter antacids as needed.  Old records are reviewed confirming endoscopy with biopsy showing H. pylori present, 2 ED visits for abdominal pain at which time CT scans showed incidental findings of diverticulosis and adrenal adenoma.  With benign abdominal exam and symptoms similar to prior, no indication for CT scan today.  Patient has asked if she can be admitted to the hospital for IV proton pump inhibitors, advised that that is not an appropriate use of hospital resources, she needs to follow-up with her gastroenterologist for appropriate outpatient management. ? ? ? ? ? ? ? ?  Final Clinical Impression(s) / ED Diagnoses ?Final diagnoses:  ?Peptic ulcer disease  ?Thrombocytosis  ? ? ?Rx / DC Orders ?ED Discharge Orders   ? ?      Ordered  ?  sucralfate (CARAFATE) 1 g tablet  3 times daily with meals & bedtime       ? 11/02/21 2324  ? ?  ?  ? ?  ? ? ?  ?Dione Booze, MD ?11/02/21 2335 ? ?

## 2021-11-03 ENCOUNTER — Telehealth: Payer: Self-pay

## 2021-11-03 NOTE — Telephone Encounter (Signed)
Cirigliano, [name], DO  [name], RN; Zehr, Princella Pellegrini, PA-C ?Please call to check in on patient.  ? ?If ongoing symptoms, can schedule OV to discuss treatment options for H. Pylori as she has been otherwise intolerant to H pylori tx in the past.   ?  ?   ? ?

## 2021-11-03 NOTE — Telephone Encounter (Signed)
Attempted to call pt. Left voicemail for pt to call back.  ?

## 2021-11-04 NOTE — Telephone Encounter (Signed)
Left message for pt to call back  °

## 2021-11-05 NOTE — Telephone Encounter (Signed)
Left message for pt to call back  °

## 2021-11-09 NOTE — Telephone Encounter (Signed)
Letter mailed to pt.  

## 2021-12-04 ENCOUNTER — Other Ambulatory Visit: Payer: Self-pay

## 2021-12-04 ENCOUNTER — Emergency Department (HOSPITAL_COMMUNITY)
Admission: EM | Admit: 2021-12-04 | Discharge: 2021-12-05 | Disposition: A | Discharge status: Home / Self Care | Payer: BC Managed Care – PPO | Attending: Emergency Medicine | Admitting: Emergency Medicine

## 2021-12-04 ENCOUNTER — Encounter (HOSPITAL_COMMUNITY): Payer: Self-pay

## 2021-12-04 DIAGNOSIS — R140 Abdominal distension (gaseous): Secondary | ICD-10-CM | POA: Diagnosis not present

## 2021-12-04 DIAGNOSIS — R1013 Epigastric pain: Secondary | ICD-10-CM | POA: Diagnosis present

## 2021-12-04 DIAGNOSIS — R1084 Generalized abdominal pain: Secondary | ICD-10-CM | POA: Insufficient documentation

## 2021-12-04 NOTE — ED Triage Notes (Signed)
Sharp and burning abdominal pain x 2 days. Recently had several episodes of bright red blood in stool. Admits hx of PUD and H. Pylori infection she has been struggling with since October. Denies n/v/d.  ?

## 2021-12-05 ENCOUNTER — Emergency Department (HOSPITAL_COMMUNITY): Payer: BC Managed Care – PPO

## 2021-12-05 ENCOUNTER — Encounter (HOSPITAL_COMMUNITY): Payer: Self-pay

## 2021-12-05 DIAGNOSIS — R1084 Generalized abdominal pain: Secondary | ICD-10-CM | POA: Diagnosis not present

## 2021-12-05 LAB — COMPREHENSIVE METABOLIC PANEL
ALT: 15 U/L (ref 0–44)
AST: 18 U/L (ref 15–41)
Albumin: 3.6 g/dL (ref 3.5–5.0)
Alkaline Phosphatase: 79 U/L (ref 38–126)
Anion gap: 4 — ABNORMAL LOW (ref 5–15)
BUN: 11 mg/dL (ref 6–20)
CO2: 26 mmol/L (ref 22–32)
Calcium: 8.7 mg/dL — ABNORMAL LOW (ref 8.9–10.3)
Chloride: 107 mmol/L (ref 98–111)
Creatinine, Ser: 0.74 mg/dL (ref 0.44–1.00)
GFR, Estimated: >60 mL/min (ref >60)
Glucose, Bld: 94 mg/dL (ref 70–99)
Potassium: 3.4 mmol/L — ABNORMAL LOW (ref 3.5–5.1)
Sodium: 137 mmol/L (ref 135–145)
Total Bilirubin: 0.3 mg/dL (ref 0.3–1.2)
Total Protein: 7 g/dL (ref 6.5–8.1)

## 2021-12-05 LAB — CBC WITH DIFFERENTIAL/PLATELET
Abs Immature Granulocytes: 0.02 10*3/uL (ref 0.00–0.07)
Basophils Absolute: 0 10*3/uL (ref 0.0–0.1)
Basophils Relative: 1 %
Eosinophils Absolute: 0.2 10*3/uL (ref 0.0–0.5)
Eosinophils Relative: 2 %
HCT: 37.3 % (ref 36.0–46.0)
Hemoglobin: 11.6 g/dL — ABNORMAL LOW (ref 12.0–15.0)
Immature Granulocytes: 0 %
Lymphocytes Relative: 43 %
Lymphs Abs: 2.9 10*3/uL (ref 0.7–4.0)
MCH: 25.2 pg — ABNORMAL LOW (ref 26.0–34.0)
MCHC: 31.1 g/dL (ref 30.0–36.0)
MCV: 80.9 fL (ref 80.0–100.0)
Monocytes Absolute: 0.7 10*3/uL (ref 0.1–1.0)
Monocytes Relative: 10 %
Neutro Abs: 3 10*3/uL (ref 1.7–7.7)
Neutrophils Relative %: 44 %
Platelets: 426 10*3/uL — ABNORMAL HIGH (ref 150–400)
RBC: 4.61 MIL/uL (ref 3.87–5.11)
RDW: 16.2 % — ABNORMAL HIGH (ref 11.5–15.5)
WBC: 6.9 10*3/uL (ref 4.0–10.5)
nRBC: 0 % (ref 0.0–0.2)

## 2021-12-05 LAB — URINALYSIS, ROUTINE W REFLEX MICROSCOPIC
Bilirubin Urine: NEGATIVE
Glucose, UA: NEGATIVE mg/dL
Hgb urine dipstick: NEGATIVE
Ketones, ur: NEGATIVE mg/dL
Leukocytes,Ua: NEGATIVE
Nitrite: NEGATIVE
Protein, ur: NEGATIVE mg/dL
Specific Gravity, Urine: 1.013 (ref 1.005–1.030)
pH: 6 (ref 5.0–8.0)

## 2021-12-05 LAB — PREGNANCY, URINE: Preg Test, Ur: NEGATIVE

## 2021-12-05 LAB — LIPASE, BLOOD: Lipase: 37 U/L (ref 11–51)

## 2021-12-05 MED ORDER — IOHEXOL 300 MG/ML  SOLN
100.0000 mL | Freq: Once | INTRAMUSCULAR | Status: AC | PRN
  Administered 2021-12-05: 100 mL via INTRAVENOUS

## 2021-12-05 MED ORDER — LIDOCAINE VISCOUS HCL 2 % MT SOLN
15.0000 mL | Freq: Once | OROMUCOSAL | Status: AC
Start: 2021-12-05 — End: 2021-12-05
  Administered 2021-12-05: 15 mL via ORAL
  Filled 2021-12-05: qty 15

## 2021-12-05 MED ORDER — SODIUM CHLORIDE 0.9 % IV BOLUS
500.0000 mL | Freq: Once | INTRAVENOUS | Status: AC
  Administered 2021-12-05: 500 mL via INTRAVENOUS

## 2021-12-05 MED ORDER — SODIUM CHLORIDE (PF) 0.9 % IJ SOLN
INTRAMUSCULAR | Status: AC
  Filled 2021-12-05: qty 50

## 2021-12-05 MED ORDER — FAMOTIDINE IN NACL 20-0.9 MG/50ML-% IV SOLN
20.0000 mg | Freq: Once | INTRAVENOUS | Status: AC
  Administered 2021-12-05: 20 mg via INTRAVENOUS
  Filled 2021-12-05: qty 50

## 2021-12-05 MED ORDER — ALUM & MAG HYDROXIDE-SIMETH 200-200-20 MG/5ML PO SUSP
30.0000 mL | Freq: Once | ORAL | Status: AC
Start: 2021-12-05 — End: 2021-12-05
  Administered 2021-12-05: 30 mL via ORAL
  Filled 2021-12-05: qty 30

## 2021-12-05 MED ORDER — FENTANYL CITRATE PF 50 MCG/ML IJ SOSY
50.0000 ug | PREFILLED_SYRINGE | Freq: Once | INTRAMUSCULAR | Status: AC
  Administered 2021-12-05: 50 ug via INTRAVENOUS
  Filled 2021-12-05: qty 1

## 2021-12-05 NOTE — Discharge Instructions (Addendum)
Please call your gastroenterologist for close follow-up.  Continue to take your Carafate, dicyclomine and Pepcid as prescribed. ? ?You had a CT scan performed today that showed no changes to your adrenal adenoma.  Please follow-up with your family doctor for reevaluation of this. ?

## 2021-12-05 NOTE — ED Provider Notes (Signed)
?[facility] DEPT ?Provider Note ? ? ?CSN: [id number] ?Arrival date & time: 12/04/21  2325 ? ?  ? ?History ? ?Chief Complaint  ?Patient presents with  ? Abdominal Pain  ? ? ?Kelsey Sutton is a 36 y.o. female. ? ?The history is provided by the patient and medical records.  ?Abdominal Pain ?Kelsey Sutton is a 36 y.o. female who presents to the Emergency Department complaining of abdominal pain.  She has migratory abdominal pain - epigastric, central primarily, at times radiates throughout.  Feels like her abdomen is hot to touch.  Pain has been present for three days, feels burning.  Comes and goes but worsening over all.  Pain is worse with not eating.  Broth makes it slightly better.  Has associated bloating.  Has not checked her temperature but feels feverish.  Has nausea, no vomiting.  No dysuria.  Has constipation.   ? ?Started treatment for hpylori (levaquin) 3-4 days ago and feels like that is making her sxs worse.  See  GI.   ? ?Has a hx/o PUD, Hpylori, diverticulosis.   ? ?Home Medications ?Prior to Admission medications   ?Medication Sig Start Date End Date Taking? Authorizing Provider  ?amphetamine-dextroamphetamine (ADDERALL XR) 10 MG 24 hr capsule Take 1 capsule (10 mg total) by mouth daily. 01/27/20  Yes Faustino Congress, NP  ?amphetamine-dextroamphetamine (ADDERALL XR) 30 MG 24 hr capsule Take 1 capsule (30 mg total) by mouth daily. 01/27/20  Yes Faustino Congress, NP  ?busPIRone (BUSPAR) 7.5 MG tablet Take 7.5 mg by mouth daily as needed (for anxiety). 05/04/21  Yes [provider]  ?cloNIDine (CATAPRES) 0.1 MG tablet Take 0.1 mg by mouth at bedtime as needed (sleep).   Yes [provider]  ?dicyclomine (BENTYL) 20 MG tablet Take 1 tablet (20 mg total) by mouth 2 (two) times daily. 07/08/21  Yes Domenic Moras, PA-C  ?famotidine (PEPCID) 20 MG tablet TAKE 1 TABLET(20 MG) BY MOUTH TWICE DAILY ?Patient taking differently: Take 20 mg by mouth daily  as needed for indigestion. 07/14/21  Yes Zehr, Laban Emperor, PA-C  ?levofloxacin (LEVAQUIN) 500 MG tablet Take 500 mg by mouth daily. 11/15/21  Yes [provider]  ?sucralfate (CARAFATE) 1 g tablet Take 1 tablet (1 g total) by mouth 4 (four) times daily -  with meals and at bedtime. [id number]  Yes Delora Fuel, MD  ?hydrOXYzine (ATARAX/VISTARIL) 25 MG tablet Take 1 tablet (25 mg total) by mouth every 6 (six) hours as needed for anxiety. ?Patient not taking: Reported on 12/05/2021 07/08/21   Domenic Moras, PA-C  ?   ? ?Allergies    ?Penicillins and Protonix [pantoprazole]   ? ?Review of Systems   ?Review of Systems  ?Gastrointestinal:  Positive for abdominal pain.  ?All other systems reviewed and are negative. ? ?Physical Exam ?Updated Vital Signs ?BP (!) 141/91   Pulse 74   Temp (!) 97.5 ?F (36.4 ?C) (Oral)   Resp 20   Ht 5\' 1"  (D34-534 m)   Wt 108.4 kg   SpO2 98%   BMI 45.16 kg/m?  ?Physical Exam ?Vitals and nursing note reviewed.  ?Constitutional:   ?   Appearance: She is well-developed.  ?HENT:  ?   Head: Normocephalic and atraumatic.  ?Cardiovascular:  ?   Rate and Rhythm: Normal rate and regular rhythm.  ?Pulmonary:  ?   Effort: Pulmonary effort is normal. No respiratory distress.  ?Abdominal:  ?   Palpations: Abdomen is soft.  ?   Tenderness: There is  no guarding or rebound.  ?   Comments: Mild generalized abdominal tenderness  ?Musculoskeletal:     ?   General: No tenderness.  ?Skin: ?   General: Skin is warm and dry.  ?Neurological:  ?   Mental Status: She is alert and oriented to person, place, and time.  ?Psychiatric:     ?   Behavior: Behavior normal.  ? ? ?ED Results / Procedures / Treatments   ?Labs ?(all labs ordered are listed, but only abnormal results are displayed) ?Labs Reviewed  ?COMPREHENSIVE METABOLIC PANEL - Abnormal; Notable for the following components:  ?    Result Value  ? Potassium 3.4 (*)   ? Calcium 8.7 (*)   ? Anion gap 4 (*)   ? All other components within normal limits   ?URINALYSIS, ROUTINE W REFLEX MICROSCOPIC - Abnormal; Notable for the following components:  ? APPearance HAZY (*)   ? All other components within normal limits  ?CBC WITH DIFFERENTIAL/PLATELET - Abnormal; Notable for the following components:  ? Hemoglobin 11.6 (*)   ? MCH 25.2 (*)   ? RDW 16.2 (*)   ? Platelets 426 (*)   ? All other components within normal limits  ?LIPASE, BLOOD  ?PREGNANCY, URINE  ? ? ?EKG ?None ? ?Radiology ?CT Abdomen Pelvis W Contrast ? ?Result Date: 12/05/2021 ?CLINICAL DATA:  Abdominal pain with nausea and vomiting. Bright red blood in stool. History of peptic ulcer disease. EXAM: CT ABDOMEN AND PELVIS WITH CONTRAST TECHNIQUE: Multidetector CT imaging of the abdomen and pelvis was performed using the standard protocol following bolus administration of intravenous contrast. RADIATION DOSE REDUCTION: This exam was performed according to the departmental dose-optimization program which includes automated exposure control, adjustment of the mA and/or kV according to patient size and/or use of iterative reconstruction technique. CONTRAST:  180mL OMNIPAQUE IOHEXOL 300 MG/ML  SOLN COMPARISON:  07/08/2021. FINDINGS: Lower chest: Dependent atelectasis is present at the lung bases. Hepatobiliary: No focal liver abnormality is seen. No gallstones, gallbladder wall thickening, or biliary dilatation. Pancreas: Unremarkable. No pancreatic ductal dilatation or surrounding inflammatory changes. Spleen: Normal in size without focal abnormality. Adrenals/Urinary Tract: A stable 2.7 cm left adrenal nodule is noted with indeterminate imaging characteristics. The right adrenal gland is within normal limits. No renal calculus or hydronephrosis. No obstructive uropathy. The bladder is unremarkable. Stomach/Bowel: Stomach is within normal limits. Appendix appears normal. No evidence of bowel wall thickening, distention, or inflammatory changes. No free air or pneumatosis. Colonic diverticulosis without  diverticulitis. Vascular/Lymphatic: No significant vascular findings are present. No enlarged abdominal or pelvic lymph nodes. Reproductive: Uterus and bilateral adnexa are unremarkable. Other: No ascites. Musculoskeletal: Head mild degenerative changes in the thoracic spine. No acute osseous abnormality. IMPRESSION: 1. No acute intra-abdominal process. 2. Stable left adrenal lesion measuring 2.7 cm, possible adenoma. Multiphase CT or MRI is suggested for further characterization. Electronically Signed   By: Brett Fairy M.D.   On: 12/05/2021 05:02   ? ?Procedures ?Procedures  ? ? ?Medications Ordered in ED ?Medications  ?sodium chloride 0.9 % bolus 500 mL (0 mLs Intravenous Stopped 12/05/21 0317)  ?alum & mag hydroxide-simeth (MAALOX/MYLANTA) 200-200-20 MG/5ML suspension 30 mL (30 mLs Oral Given 12/05/21 0124)  ?  And  ?lidocaine (XYLOCAINE) 2 % viscous mouth solution 15 mL (15 mLs Oral Given 12/05/21 0124)  ?fentaNYL (SUBLIMAZE) injection 50 mcg (50 mcg Intravenous Given 12/05/21 0126)  ?famotidine (PEPCID) IVPB 20 mg premix (0 mg Intravenous Stopped 12/05/21 0317)  ?fentaNYL (SUBLIMAZE) injection 50 mcg (  50 mcg Intravenous Given 12/05/21 0317)  ?iohexol (OMNIPAQUE) 300 MG/ML solution 100 mL (100 mLs Intravenous Contrast Given 12/05/21 0425)  ?sodium chloride (PF) 0.9 % injection (  Given by Other 12/05/21 0433)  ? ? ?ED Course/ Medical Decision Making/ A&P ?  ?                        ?Medical Decision Making ?Amount and/or Complexity of Data Reviewed ?Labs: ordered. ?Radiology: ordered. ? ?Risk ?OTC drugs. ?Prescription drug management. ? ? ?Patient with history of H. pylori, has difficulty tolerating PPIs as well as multiple rounds of antibiotics.  She is here with recurrence epigastric pain after starting Levaquin 4 days ago.  She has mild tenderness on examination without peritoneal findings.  Labs are near her baseline with normal pancreatic enzyme.  CT abdomen pelvis obtained, which is negative for acute  abnormality.  Discussed with patient incidental finding of a stable adrenal adenoma.  She has improvement in her symptoms after treatment in the emergency department but she did get very sleepy with the pain medications.  After consent f

## 2022-01-06 DIAGNOSIS — R110 Nausea: Secondary | ICD-10-CM | POA: Diagnosis not present

## 2022-01-06 DIAGNOSIS — I10 Essential (primary) hypertension: Secondary | ICD-10-CM | POA: Diagnosis not present

## 2022-01-06 DIAGNOSIS — R531 Weakness: Secondary | ICD-10-CM | POA: Diagnosis not present

## 2022-01-06 DIAGNOSIS — R55 Syncope and collapse: Secondary | ICD-10-CM | POA: Diagnosis not present

## 2022-01-06 DIAGNOSIS — R1111 Vomiting without nausea: Secondary | ICD-10-CM | POA: Diagnosis not present

## 2022-01-28 ENCOUNTER — Ambulatory Visit (HOSPITAL_COMMUNITY)
Admission: EM | Admit: 2022-01-28 | Discharge: 2022-01-28 | Discharge status: Absent without leave | Payer: BC Managed Care – PPO

## 2022-03-01 DIAGNOSIS — F419 Anxiety disorder, unspecified: Secondary | ICD-10-CM | POA: Diagnosis not present

## 2022-03-01 DIAGNOSIS — K219 Gastro-esophageal reflux disease without esophagitis: Secondary | ICD-10-CM | POA: Diagnosis not present

## 2022-03-01 DIAGNOSIS — F900 Attention-deficit hyperactivity disorder, predominantly inattentive type: Secondary | ICD-10-CM | POA: Diagnosis not present

## 2022-03-01 DIAGNOSIS — I10 Essential (primary) hypertension: Secondary | ICD-10-CM | POA: Diagnosis not present

## 2022-03-01 DIAGNOSIS — F321 Major depressive disorder, single episode, moderate: Secondary | ICD-10-CM | POA: Diagnosis not present

## 2022-03-18 DIAGNOSIS — K219 Gastro-esophageal reflux disease without esophagitis: Secondary | ICD-10-CM | POA: Diagnosis not present

## 2022-03-18 DIAGNOSIS — I10 Essential (primary) hypertension: Secondary | ICD-10-CM | POA: Diagnosis not present

## 2022-03-18 DIAGNOSIS — F900 Attention-deficit hyperactivity disorder, predominantly inattentive type: Secondary | ICD-10-CM | POA: Diagnosis not present

## 2022-03-18 DIAGNOSIS — F419 Anxiety disorder, unspecified: Secondary | ICD-10-CM | POA: Diagnosis not present

## 2022-05-18 DIAGNOSIS — I10 Essential (primary) hypertension: Secondary | ICD-10-CM | POA: Diagnosis not present

## 2022-05-18 DIAGNOSIS — K219 Gastro-esophageal reflux disease without esophagitis: Secondary | ICD-10-CM | POA: Diagnosis not present

## 2022-05-18 DIAGNOSIS — K588 Other irritable bowel syndrome: Secondary | ICD-10-CM | POA: Diagnosis not present

## 2022-05-18 DIAGNOSIS — F900 Attention-deficit hyperactivity disorder, predominantly inattentive type: Secondary | ICD-10-CM | POA: Diagnosis not present

## 2022-05-18 DIAGNOSIS — F419 Anxiety disorder, unspecified: Secondary | ICD-10-CM | POA: Diagnosis not present

## 2022-06-13 DIAGNOSIS — R52 Pain, unspecified: Secondary | ICD-10-CM | POA: Diagnosis not present

## 2022-06-13 DIAGNOSIS — R1084 Generalized abdominal pain: Secondary | ICD-10-CM | POA: Diagnosis not present

## 2022-06-13 DIAGNOSIS — R000 Tachycardia, unspecified: Secondary | ICD-10-CM | POA: Diagnosis not present

## 2022-06-13 DIAGNOSIS — R457 State of emotional shock and stress, unspecified: Secondary | ICD-10-CM | POA: Diagnosis not present

## 2022-07-28 DIAGNOSIS — K279 Peptic ulcer, site unspecified, unspecified as acute or chronic, without hemorrhage or perforation: Secondary | ICD-10-CM | POA: Diagnosis not present

## 2022-07-28 DIAGNOSIS — K5730 Diverticulosis of large intestine without perforation or abscess without bleeding: Secondary | ICD-10-CM | POA: Diagnosis not present

## 2022-07-28 DIAGNOSIS — K2970 Gastritis, unspecified, without bleeding: Secondary | ICD-10-CM | POA: Diagnosis not present

## 2022-07-28 DIAGNOSIS — B9681 Helicobacter pylori [H. pylori] as the cause of diseases classified elsewhere: Secondary | ICD-10-CM | POA: Diagnosis not present

## 2024-03-29 IMAGING — CT CT ABD-PELV W/ CM
2 of 4 series · 16 of 46 positions shown, 18 images · IV contrast (agent unspecified)
Comparison: 07/08/2021.

CLINICAL DATA: Abdominal pain with nausea and vomiting. Bright red
blood in stool. History of peptic ulcer disease.

EXAM:
CT ABDOMEN AND PELVIS WITH CONTRAST
TECHNIQUE: Multidetector CT imaging of the abdomen and pelvis was performed
using the standard protocol following bolus administration of
intravenous contrast.

[Series 2: axial st · axial · 0.85mm/px · z∈[-510,-94]mm · 13 of 93 slices shown, 15 images]
[im 5/93  soft-tissue]
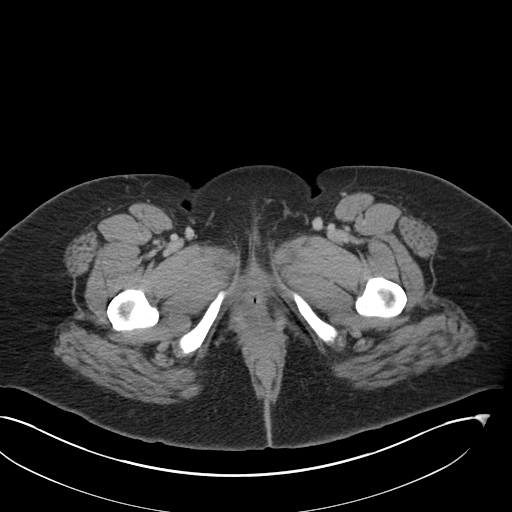
[im 5/93  bone]
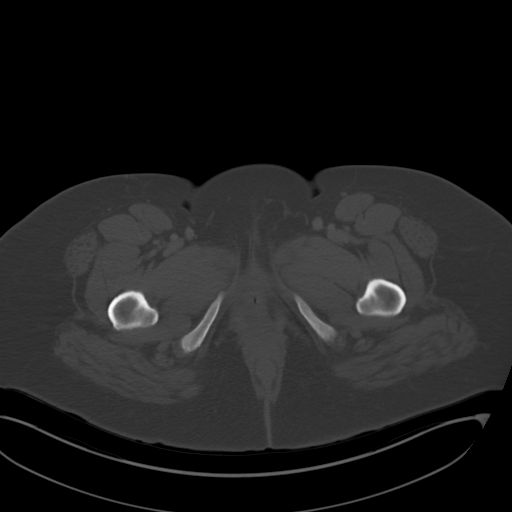
[im 15/93  soft-tissue]
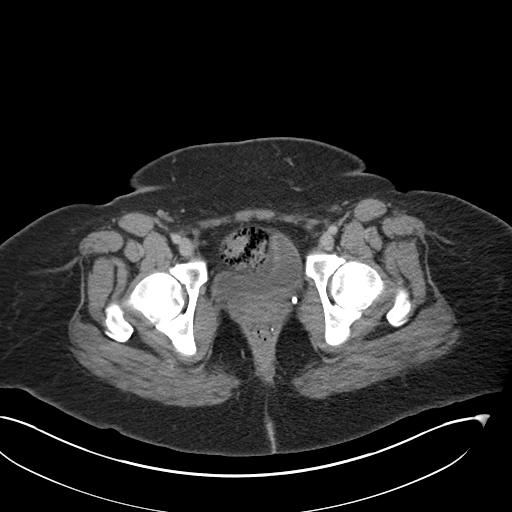
[im 20/93  soft-tissue]
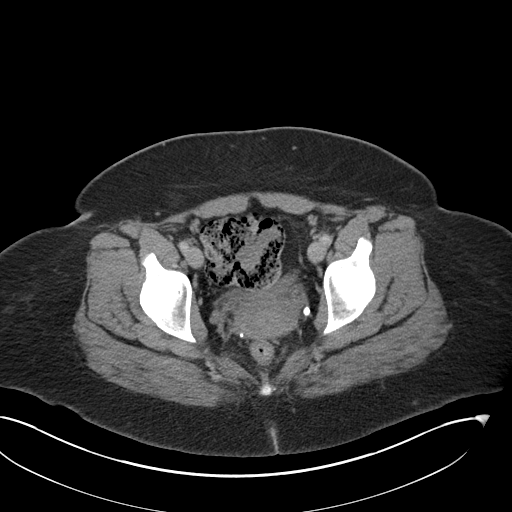
[im 25/93  soft-tissue]
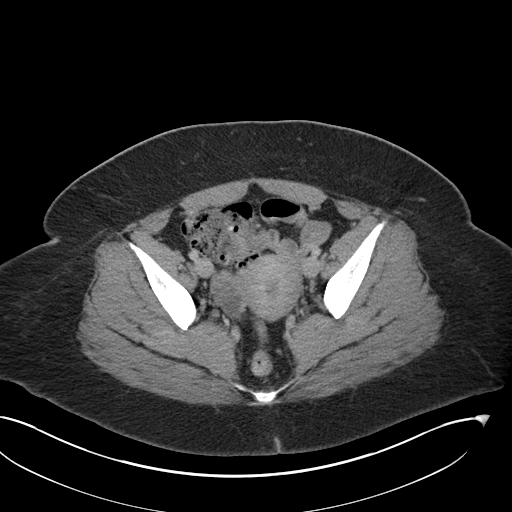
[im 34/93  soft-tissue]
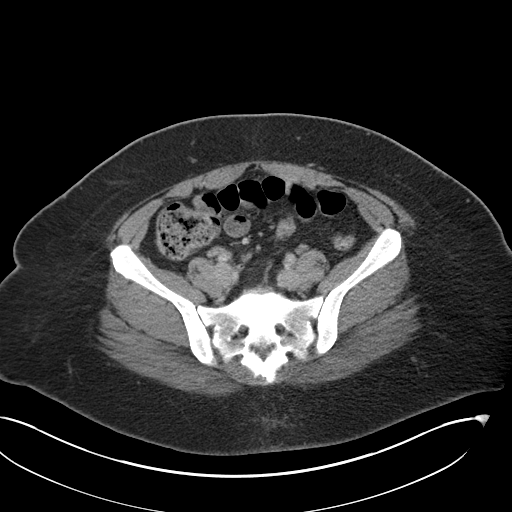
[im 39/93  soft-tissue]
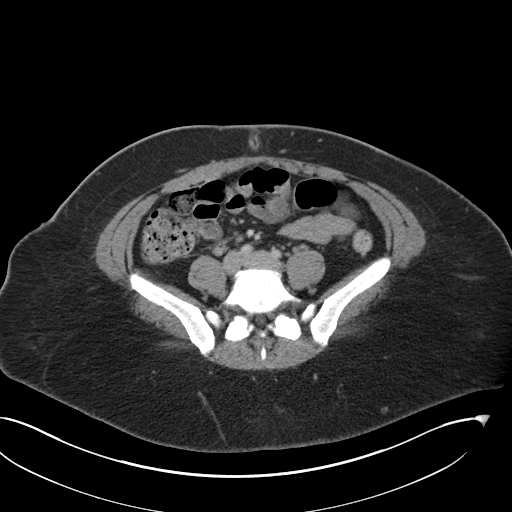
[im 49/93  soft-tissue]
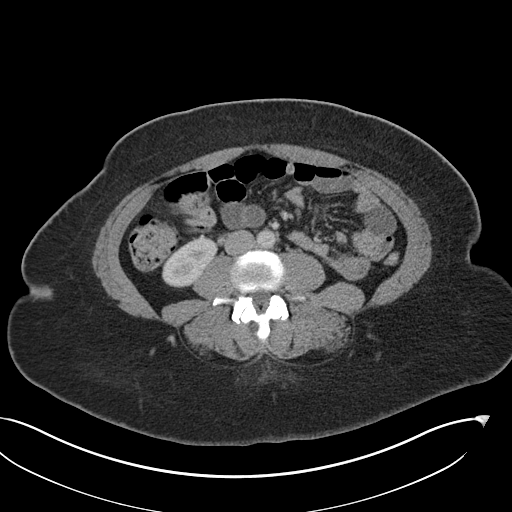
[im 54/93  soft-tissue]
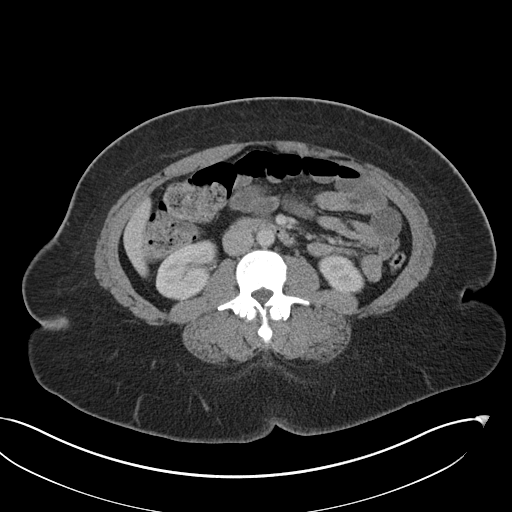
[im 59/93  soft-tissue]
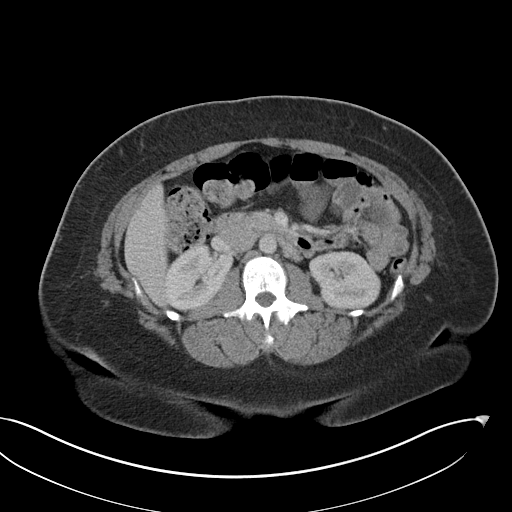
[im 59/93  bone]
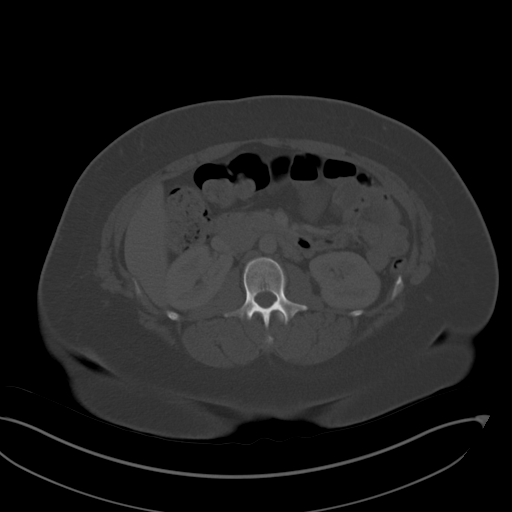
[im 68/93  soft-tissue]
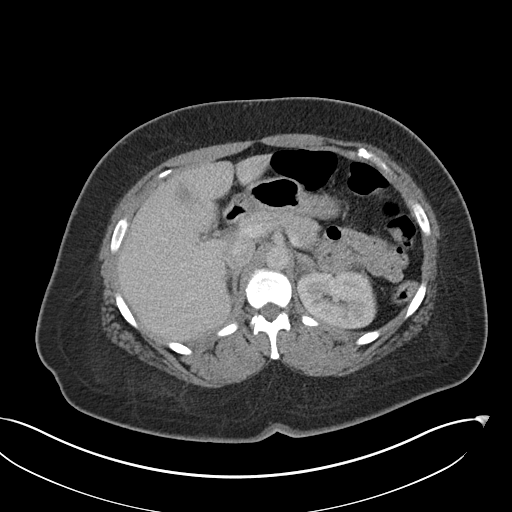
[im 73/93  soft-tissue]
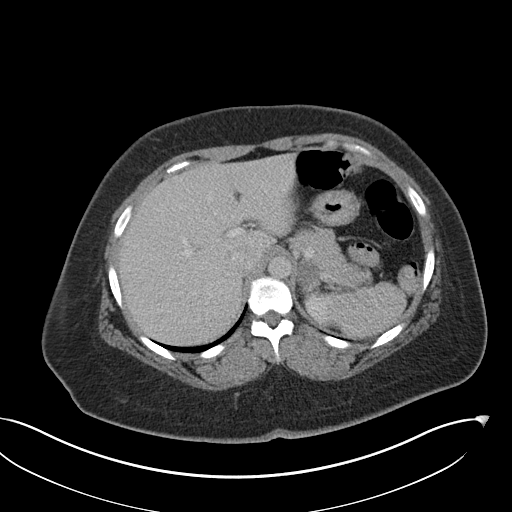
[im 78/93  soft-tissue]
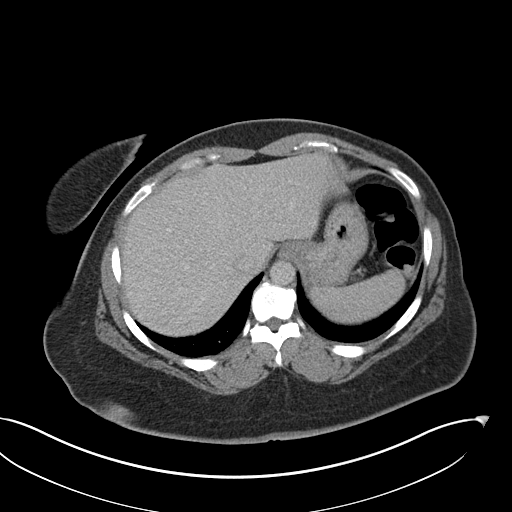
[im 88/93  soft-tissue]
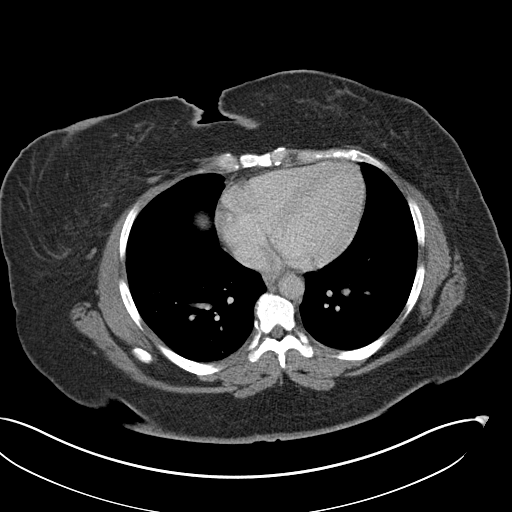

[Series 5: coronal st · coronal · 0.90mm/px · 3 of 161 slices shown]
[im 54/161  soft-tissue]
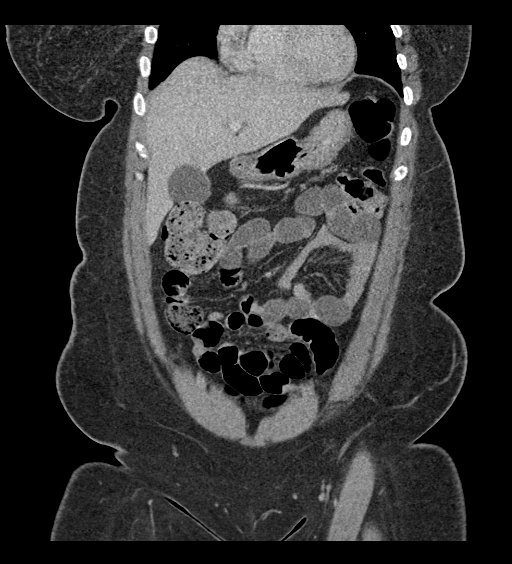
[im 72/161  soft-tissue]
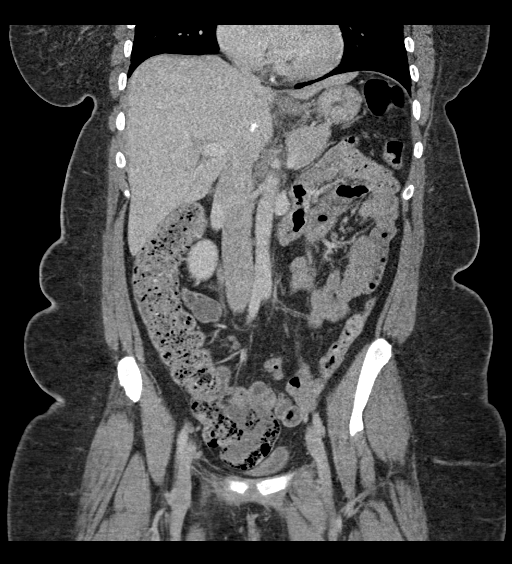
[im 89/161  soft-tissue]
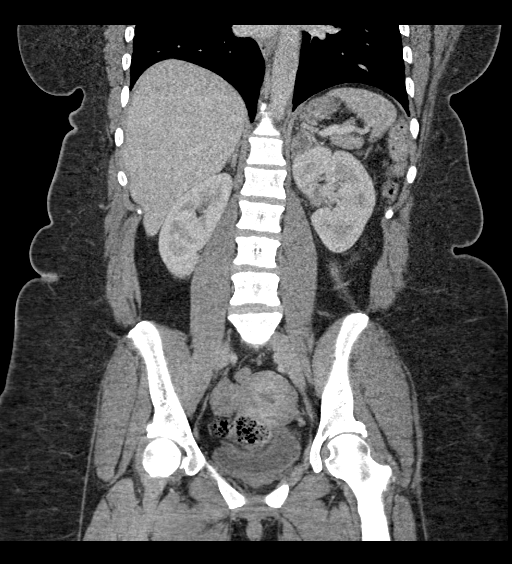

[16 of 46 positions shown; findings below may reference images not displayed]

RADIATION DOSE REDUCTION: This exam was performed according to the
departmental dose-optimization program which includes automated
exposure control, adjustment of the mA and/or kV according to
patient size and/or use of iterative reconstruction technique.

CONTRAST:  100mL OMNIPAQUE IOHEXOL 300 MG/ML  SOLN
FINDINGS: Lower chest: Dependent atelectasis is present at the lung bases.

Hepatobiliary: No focal liver abnormality is seen. No gallstones,
gallbladder wall thickening, or biliary dilatation.

Pancreas: Unremarkable. No pancreatic ductal dilatation or
surrounding inflammatory changes.

Spleen: Normal in size without focal abnormality.

Adrenals/Urinary Tract: A stable 2.7 cm left adrenal nodule is noted
with indeterminate imaging characteristics. The right adrenal gland
is within normal limits. No renal calculus or hydronephrosis. No
obstructive uropathy. The bladder is unremarkable.

Stomach/Bowel: Stomach is within normal limits. Appendix appears
normal. No evidence of bowel wall thickening, distention, or
inflammatory changes. No free air or pneumatosis. Colonic
diverticulosis without diverticulitis.

Vascular/Lymphatic: No significant vascular findings are present. No
enlarged abdominal or pelvic lymph nodes.

Reproductive: Uterus and bilateral adnexa are unremarkable.

Other: No ascites.

Musculoskeletal: Head mild degenerative changes in the thoracic
spine. No acute osseous abnormality.
IMPRESSION: 1. No acute intra-abdominal process.
2. Stable left adrenal lesion measuring 2.7 cm, possible adenoma.
Multiphase CT or MRI is suggested for further characterization.
